# Patient Record
Sex: Female | Born: 1975 | Race: Black or African American | Hispanic: No | Marital: Single | Smoking: Current every day smoker
Health system: Southern US, Community
[De-identification: ages and names within clinical notes are randomized; demographics above are authoritative.]

## PROBLEM LIST (undated history)

## (undated) DIAGNOSIS — L309 Dermatitis, unspecified: Secondary | ICD-10-CM

## (undated) DIAGNOSIS — E039 Hypothyroidism, unspecified: Secondary | ICD-10-CM

## (undated) DIAGNOSIS — G43909 Migraine, unspecified, not intractable, without status migrainosus: Secondary | ICD-10-CM

## (undated) DIAGNOSIS — D649 Anemia, unspecified: Secondary | ICD-10-CM

## (undated) DIAGNOSIS — E11649 Type 2 diabetes mellitus with hypoglycemia without coma: Secondary | ICD-10-CM

## (undated) HISTORY — PX: TUBAL LIGATION: SHX77

---

## 2010-07-16 ENCOUNTER — Emergency Department (HOSPITAL_COMMUNITY)
Admission: EM | Admit: 2010-07-16 | Discharge: 2010-07-16 | Payer: Self-pay | Source: Home / Self Care | Admitting: Family Medicine

## 2010-07-19 LAB — POCT PREGNANCY, URINE: Preg Test, Ur: NEGATIVE

## 2010-07-19 LAB — POCT URINALYSIS DIPSTICK
Protein, ur: NEGATIVE mg/dL
Urobilinogen, UA: 0.2 mg/dL (ref 0.0–1.0)

## 2010-10-10 ENCOUNTER — Inpatient Hospital Stay (HOSPITAL_COMMUNITY)
Admission: AD | Admit: 2010-10-10 | Discharge: 2010-10-10 | Disposition: A | Discharge status: Home / Self Care | Payer: Medicaid Other | Source: Ambulatory Visit | Attending: Family Medicine | Admitting: Family Medicine

## 2010-10-10 DIAGNOSIS — N949 Unspecified condition associated with female genital organs and menstrual cycle: Secondary | ICD-10-CM | POA: Insufficient documentation

## 2010-10-10 DIAGNOSIS — N938 Other specified abnormal uterine and vaginal bleeding: Secondary | ICD-10-CM | POA: Insufficient documentation

## 2010-10-10 LAB — CBC
HCT: 35.8 % — ABNORMAL LOW (ref 36.0–46.0)
Hemoglobin: 11.9 g/dL — ABNORMAL LOW (ref 12.0–15.0)
MCV: 86.9 fL (ref 78.0–100.0)
RBC: 4.12 MIL/uL (ref 3.87–5.11)
WBC: 8.9 10*3/uL (ref 4.0–10.5)

## 2010-10-10 LAB — URINALYSIS, ROUTINE W REFLEX MICROSCOPIC
Bilirubin Urine: NEGATIVE
Ketones, ur: NEGATIVE mg/dL
Protein, ur: NEGATIVE mg/dL
Urobilinogen, UA: 1 mg/dL (ref 0.0–1.0)

## 2010-10-10 LAB — HCG, QUANTITATIVE, PREGNANCY: hCG, Beta Chain, Quant, S: <2 m[IU]/mL (ref <5)

## 2010-10-10 LAB — WET PREP, GENITAL
Clue Cells Wet Prep HPF POC: NONE SEEN
Trich, Wet Prep: NONE SEEN

## 2010-10-11 LAB — GC/CHLAMYDIA PROBE AMP, GENITAL: GC Probe Amp, Genital: NEGATIVE

## 2011-06-23 ENCOUNTER — Inpatient Hospital Stay (HOSPITAL_COMMUNITY)
Admission: AD | Admit: 2011-06-23 | Discharge: 2011-06-24 | Disposition: A | Discharge status: Home / Self Care | Payer: Medicaid Other | Source: Ambulatory Visit | Attending: Obstetrics & Gynecology | Admitting: Obstetrics & Gynecology

## 2011-06-23 ENCOUNTER — Encounter (HOSPITAL_COMMUNITY): Payer: Self-pay | Admitting: Obstetrics and Gynecology

## 2011-06-23 DIAGNOSIS — N911 Secondary amenorrhea: Secondary | ICD-10-CM

## 2011-06-23 DIAGNOSIS — R112 Nausea with vomiting, unspecified: Secondary | ICD-10-CM

## 2011-06-23 DIAGNOSIS — N912 Amenorrhea, unspecified: Secondary | ICD-10-CM

## 2011-06-23 HISTORY — DX: Dermatitis, unspecified: L30.9

## 2011-06-23 HISTORY — DX: Hypothyroidism, unspecified: E03.9

## 2011-06-23 LAB — URINALYSIS, ROUTINE W REFLEX MICROSCOPIC
Bilirubin Urine: NEGATIVE
Glucose, UA: NEGATIVE mg/dL
Hgb urine dipstick: NEGATIVE
Protein, ur: NEGATIVE mg/dL
Urobilinogen, UA: 1 mg/dL (ref 0.0–1.0)

## 2011-06-23 MED ORDER — ONDANSETRON 8 MG PO TBDP
8.0000 mg | ORAL_TABLET | Freq: Once | ORAL | Status: AC
  Administered 2011-06-23: 8 mg via ORAL
  Filled 2011-06-23: qty 1

## 2011-06-23 NOTE — ED Provider Notes (Signed)
History   Cheryl Gilbert is a 35 y.o. year old [id number] female at 5.[redacted] weeks gestation by LMP who presents to [facility] reporting N/V x ~2 weeks and experiencing a upper adb cramp after dry-heaving today that resolved spontaneously. She denies cramping or bleeding. She states she had a faintly pos UPT a few days ago, but that UPTs "do not work for her until later in the pregnancy". She wants to verify that she is pregnant.    CSN: [id number]  Arrival date & time 06/23/11  2119   None     Chief Complaint  Patient presents with  . Abdominal Pain    (Consider location/radiation/quality/duration/timing/severity/associated sxs/prior treatment) HPI  Past Medical History  Diagnosis Date  . Eczema   . Hypothyroidism     "Not on meds since 1996"  . Asthma     Past Surgical History  Procedure Date  . Tubal ligation 11/01/2000    Family History  Problem Relation Age of Onset  . Cancer Mother   . Mental illness Mother   . Multiple sclerosis Father   . Hypertension Maternal Grandmother   . Diabetes Maternal Grandfather   . Hypertension Maternal Grandfather     History  Substance Use Topics  . Smoking status: Passive Smoker -- 0.2 packs/day for 6 years    Types: Cigarettes  . Smokeless tobacco: Not on file  . Alcohol Use: No    OB History    Grav Para Term Preterm Abortions TAB SAB Ect Mult Living   7 5 3 2 2  0 2 0 0 5      Review of Systems  Constitutional: Negative for fever and chills.  Gastrointestinal: Positive for nausea and vomiting. Negative for abdominal pain, diarrhea and constipation.  Genitourinary: Negative for dysuria, hematuria, vaginal bleeding and vaginal discharge.  Neurological: Negative for weakness.    Allergies  Review of patient's allergies indicates no known allergies.  Home Medications  No current outpatient prescriptions on file.  Temp(Src) 97.6 F (36.4 C) (Oral)  Resp 20  Ht 5\' 3"  (1.6 m)  Wt 113.399 kg (250 lb)  BMI 44.29  kg/m2  LMP 05/15/2011  Physical Exam  Constitutional: She is oriented to person, place, and time. She appears well-developed and well-nourished. No distress.  Cardiovascular: Normal rate.   Pulmonary/Chest: Effort normal.  Abdominal: Soft. She exhibits distension. There is no tenderness. There is no rebound and no guarding.  Neurological: She is alert and oriented to person, place, and time.  Skin: Skin is warm and dry.  Psychiatric: She has a normal mood and affect.    ED Course  Procedures (including critical care time) Results for orders placed during the hospital encounter of 06/23/11 (from the past 48 hour(s))  URINALYSIS, ROUTINE W REFLEX MICROSCOPIC     Status: Abnormal   Collection Time   06/23/11  9:40 PM      Component Value Range Comment   Color, Urine YELLOW  YELLOW     APPearance CLEAR  CLEAR     Specific Gravity, Urine 1.025  1.005 - 1.030     pH 6.0  5.0 - 8.0     Glucose, UA NEGATIVE  NEGATIVE (mg/dL)    Hgb urine dipstick NEGATIVE  NEGATIVE     Bilirubin Urine NEGATIVE  NEGATIVE     Ketones, ur 15 (*) NEGATIVE (mg/dL)    Protein, ur NEGATIVE  NEGATIVE (mg/dL)    Urobilinogen, UA 1.0  0.0 - 1.0 (mg/dL)    Nitrite NEGATIVE  NEGATIVE     Leukocytes, UA NEGATIVE  NEGATIVE  MICROSCOPIC NOT DONE ON URINES WITH NEGATIVE PROTEIN, BLOOD, LEUKOCYTES, NITRITE, OR GLUCOSE <1000 mg/dL.  POCT PREGNANCY, URINE     Status: Normal   Collection Time   06/23/11 10:00 PM      Component Value Range Comment   Preg Test, Ur NEGATIVE     HCG, QUANTITATIVE, PREGNANCY     Status: Normal   Collection Time   06/23/11 10:05 PM      Component Value Range Comment   hCG, Beta Chain, Quant, S < 1: REPEATED TO VERIFY  <5 (mIU/mL)     Nausea resolved w/ Zofran  MDM  Assessment: 1. Secondary amenorrhea w/ unknown etiology 2. N/V of unknown etiology  Plan; 1. D/C home 2. F/U w/ Gynecologist if no menses in 1 month 3. F/U w/ PCP for N/V  Dorathy Kinsman 06/24/2011 1:46 AM

## 2011-06-23 NOTE — Progress Notes (Signed)
"  I only had coffee this morning and felt nauseated about 1000.  I vomited and had a sharp pain in my abd that felt like a Charley Horse does in your leg."

## 2011-06-24 LAB — HCG, QUANTITATIVE, PREGNANCY: hCG, Beta Chain, Quant, S: <1 m[IU]/mL (ref <5)

## 2011-07-27 ENCOUNTER — Emergency Department (HOSPITAL_COMMUNITY)
Admission: EM | Admit: 2011-07-27 | Discharge: 2011-07-28 | Disposition: A | Discharge status: Home / Self Care | Payer: Self-pay | Attending: Emergency Medicine | Admitting: Emergency Medicine

## 2011-07-27 ENCOUNTER — Encounter (HOSPITAL_COMMUNITY): Payer: Self-pay | Admitting: Emergency Medicine

## 2011-07-27 DIAGNOSIS — N83202 Unspecified ovarian cyst, left side: Secondary | ICD-10-CM

## 2011-07-27 DIAGNOSIS — E039 Hypothyroidism, unspecified: Secondary | ICD-10-CM | POA: Insufficient documentation

## 2011-07-27 DIAGNOSIS — R1032 Left lower quadrant pain: Secondary | ICD-10-CM | POA: Insufficient documentation

## 2011-07-27 DIAGNOSIS — N83209 Unspecified ovarian cyst, unspecified side: Secondary | ICD-10-CM | POA: Insufficient documentation

## 2011-07-27 DIAGNOSIS — J45909 Unspecified asthma, uncomplicated: Secondary | ICD-10-CM | POA: Insufficient documentation

## 2011-07-27 DIAGNOSIS — R109 Unspecified abdominal pain: Secondary | ICD-10-CM | POA: Insufficient documentation

## 2011-07-27 LAB — CBC
HCT: 37.1 % (ref 36.0–46.0)
MCHC: 35 g/dL (ref 30.0–36.0)
MCV: 83.9 fL (ref 78.0–100.0)
Platelets: 326 10*3/uL (ref 150–400)
RDW: 13.7 % (ref 11.5–15.5)
WBC: 9.8 10*3/uL (ref 4.0–10.5)

## 2011-07-27 LAB — DIFFERENTIAL
Basophils Absolute: 0 10*3/uL (ref 0.0–0.1)
Basophils Relative: 0 % (ref 0–1)
Eosinophils Absolute: 0.2 10*3/uL (ref 0.0–0.7)
Eosinophils Relative: 2 % (ref 0–5)
Lymphocytes Relative: 43 % (ref 12–46)
Monocytes Absolute: 0.6 10*3/uL (ref 0.1–1.0)

## 2011-07-27 LAB — BASIC METABOLIC PANEL WITH GFR
BUN: 7 mg/dL (ref 6–23)
CO2: 27 meq/L (ref 19–32)
Calcium: 9.6 mg/dL (ref 8.4–10.5)
Chloride: 101 meq/L (ref 96–112)
Creatinine, Ser: 0.84 mg/dL (ref 0.50–1.10)
GFR calc Af Amer: >90 mL/min
GFR calc non Af Amer: 90 mL/min — ABNORMAL LOW
Glucose, Bld: 94 mg/dL (ref 70–99)
Potassium: 3.8 meq/L (ref 3.5–5.1)
Sodium: 135 meq/L (ref 135–145)

## 2011-07-27 LAB — URINALYSIS, MICROSCOPIC ONLY
Bilirubin Urine: NEGATIVE
Nitrite: NEGATIVE
Specific Gravity, Urine: 1.028 (ref 1.005–1.030)
pH: 6 (ref 5.0–8.0)

## 2011-07-27 LAB — POCT PREGNANCY, URINE: Preg Test, Ur: NEGATIVE

## 2011-07-27 NOTE — ED Provider Notes (Signed)
History     CSN: [id number]  Arrival date & time 07/27/11  2208   First MD Initiated Contact with Patient 07/27/11 2352      Chief Complaint  Patient presents with  . Abdominal Pain    (Consider location/radiation/quality/duration/timing/severity/associated sxs/prior treatment) HPI This is a 36 year old black female who started having flank pain radiating to her left lower quadrant this afternoon. The pain has been waxing and waning and is not constant. It is described as a deep dull pain. It is moderate to severe. It is worse with palpation of the flank but not the abdomen. She took some ibuprofen earlier with improvement. There's been no associated nausea, vomiting, hematuria or dysuria. She's had no fevers or chills. She has had vaginal spotting for about 3 days. Her last normal menstrual period was November 19 of last year. She states she's been pregnant 5 times and on four of those occasions her pregnancy test was negative. She also states she has had bilateral tubal ligation but that they had "become untied".  Past Medical History  Diagnosis Date  . Eczema   . Hypothyroidism     "Not on meds since 1996"  . Asthma     Past Surgical History  Procedure Date  . Tubal ligation 11/01/2000    Family History  Problem Relation Age of Onset  . Cancer Mother   . Mental illness Mother   . Multiple sclerosis Father   . Hypertension Maternal Grandmother   . Diabetes Maternal Grandfather   . Hypertension Maternal Grandfather     History  Substance Use Topics  . Smoking status: Passive Smoker -- 0.2 packs/day for 6 years    Types: Cigarettes  . Smokeless tobacco: Not on file  . Alcohol Use: No    OB History    Grav Para Term Preterm Abortions TAB SAB Ect Mult Living   7 5 3 2 2  0 2 0 0 5      Review of Systems  All other systems reviewed and are negative.    Allergies  Review of patient's allergies indicates no known allergies.  Home Medications   Current  Outpatient Rx  Name Route Sig Dispense Refill  . ALBUTEROL SULFATE HFA 108 (90 BASE) MCG/ACT IN AERS Inhalation Inhale 1 puff into the lungs 2 (two) times daily as needed. For asthma. Pt states, if she has to use this more than a few times a day, she will then use a nebulizer     . IBUPROFEN 200 MG PO TABS Oral Take 400 mg by mouth daily as needed. As needed for pain.      BP 121/92  Pulse 84  Temp(Src) 97.7 F (36.5 C) (Oral)  Resp 17  SpO2 100%  LMP 05/15/2011  Physical Exam General: Well-developed, well-nourished female in no acute distress; appearance consistent with age of record HENT: normocephalic, atraumatic Eyes: pupils equal round and reactive to light; extraocular muscles intact Neck: supple Heart: regular rate and rhythm Lungs: clear to auscultation bilaterally Abdomen: soft; nondistended; nontender; no masses or hepatosplenomegaly; bowel sounds present GU: Moderate left flank tenderness Extremities: No deformity; full range of motion; pulses normal Neurologic: Awake, alert and oriented; motor function intact in all extremities and symmetric; no facial droop Skin: Warm and dry Psychiatric: Normal mood and affect    ED Course  Procedures (including critical care time)     MDM   Nursing notes and vitals signs, including pulse oximetry, reviewed.  Summary of this visit's results, reviewed by  myself:  Labs:  Results for orders placed during the hospital encounter of 07/27/11  URINALYSIS, WITH MICROSCOPIC      Component Value Range   Color, Urine YELLOW  YELLOW    APPearance CLEAR  CLEAR    Specific Gravity, Urine 1.028  1.005 - 1.030    pH 6.0  5.0 - 8.0    Glucose, UA NEGATIVE  NEGATIVE (mg/dL)   Hgb urine dipstick LARGE (*) NEGATIVE    Bilirubin Urine NEGATIVE  NEGATIVE    Ketones, ur 15 (*) NEGATIVE (mg/dL)   Protein, ur NEGATIVE  NEGATIVE (mg/dL)   Urobilinogen, UA 1.0  0.0 - 1.0 (mg/dL)   Nitrite NEGATIVE  NEGATIVE    Leukocytes, UA NEGATIVE   NEGATIVE    WBC, UA 0-2  <3 (WBC/hpf)   RBC / HPF 7-10  <3 (RBC/hpf)   Bacteria, UA FEW (*) RARE    Squamous Epithelial / LPF FEW (*) RARE    Urine-Other MUCOUS PRESENT    CBC      Component Value Range   WBC 9.8  4.0 - 10.5 (K/uL)   RBC 4.42  3.87 - 5.11 (MIL/uL)   Hemoglobin 13.0  12.0 - 15.0 (g/dL)   HCT 91.4  78.2 - 95.6 (%)   MCV 83.9  78.0 - 100.0 (fL)   MCH 29.4  26.0 - 34.0 (pg)   MCHC 35.0  30.0 - 36.0 (g/dL)   RDW 21.3  08.6 - 57.8 (%)   Platelets 326  150 - 400 (K/uL)  DIFFERENTIAL      Component Value Range   Neutrophils Relative 49  43 - 77 (%)   Neutro Abs 4.8  1.7 - 7.7 (K/uL)   Lymphocytes Relative 43  12 - 46 (%)   Lymphs Abs 4.2 (*) 0.7 - 4.0 (K/uL)   Monocytes Relative 6  3 - 12 (%)   Monocytes Absolute 0.6  0.1 - 1.0 (K/uL)   Eosinophils Relative 2  0 - 5 (%)   Eosinophils Absolute 0.2  0.0 - 0.7 (K/uL)   Basophils Relative 0  0 - 1 (%)   Basophils Absolute 0.0  0.0 - 0.1 (K/uL)  BASIC METABOLIC PANEL      Component Value Range   Sodium 135  135 - 145 (mEq/L)   Potassium 3.8  3.5 - 5.1 (mEq/L)   Chloride 101  96 - 112 (mEq/L)   CO2 27  19 - 32 (mEq/L)   Glucose, Bld 94  70 - 99 (mg/dL)   BUN 7  6 - 23 (mg/dL)   Creatinine, Ser 4.69  0.50 - 1.10 (mg/dL)   Calcium 9.6  8.4 - 62.9 (mg/dL)   GFR calc non Af Amer 90 (*) >90 (mL/min)   GFR calc Af Amer >90  >90 (mL/min)  POCT PREGNANCY, URINE      Component Value Range   Preg Test, Ur NEGATIVE  NEGATIVE     Imaging Studies: US Transvaginal Non-ob  08-26-2011  *RADIOLOGY REPORT*  Clinical Data: Pain.  TRANSABDOMINAL AND TRANSVAGINAL ULTRASOUND OF PELVIS Technique:  Both transabdominal and transvaginal ultrasound examinations of the pelvis were performed. Transabdominal technique was performed for global imaging of the pelvis including uterus, ovaries, adnexal regions, and pelvic cul-de-sac.  Comparison: None.   It was necessary to proceed with endovaginal exam following the transabdominal exam to visualize  the uterus and adnexal structures.  Findings:  Uterus: The uterus measures 9.9 x 5.3 x 6 cm.  No myometrial mass lesions. Small cysts in  the cervix consistent with Nabothian cysts.  Endometrium: Endometrial stripe thickness measures 1.4 cm which is mildly increased but may be due to secretory phase.  No abnormal endometrial fluid collections.  Endometrial echotexture is homogeneous.  Right ovary:  The right ovary measures 3 x 1.2 x 3 cm.  No abnormal adnexal masses.  Left ovary: The left ovary measures 5 x 3.4 x 3.1 cm.  In the left ovary, there is a complex septated cyst measuring 4.8 x 2.7 x 2.4 cm.  Flow is demonstrated within the septum on color flow Doppler imaging.  This is likely to represent a complex functional cyst. Short-interval follow up ultrasound in 6-12 weeks is recommended, preferably during the week following the patient's normal menses.  Other findings: No free fluid  IMPRESSION: Mild prominence of endometrial stripe with homogeneous appearance, likely due to secretory phase.  Uterus and right ovary are unremarkable.  Complex septated cyst in the left ovary.  Followup in 6-12 weeks is recommended.  Original Report Authenticated By: Marlon Pel, M.D.   US Pelvis Complete  07/28/2011  *RADIOLOGY REPORT*  Clinical Data: Pain.  TRANSABDOMINAL AND TRANSVAGINAL ULTRASOUND OF PELVIS Technique:  Both transabdominal and transvaginal ultrasound examinations of the pelvis were performed. Transabdominal technique was performed for global imaging of the pelvis including uterus, ovaries, adnexal regions, and pelvic cul-de-sac.  Comparison: None.   It was necessary to proceed with endovaginal exam following the transabdominal exam to visualize the uterus and adnexal structures.  Findings:  Uterus: The uterus measures 9.9 x 5.3 x 6 cm.  No myometrial mass lesions. Small cysts in the cervix consistent with Nabothian cysts.  Endometrium: Endometrial stripe thickness measures 1.4 cm which is mildly  increased but may be due to secretory phase.  No abnormal endometrial fluid collections.  Endometrial echotexture is homogeneous.  Right ovary:  The right ovary measures 3 x 1.2 x 3 cm.  No abnormal adnexal masses.  Left ovary: The left ovary measures 5 x 3.4 x 3.1 cm.  In the left ovary, there is a complex septated cyst measuring 4.8 x 2.7 x 2.4 cm.  Flow is demonstrated within the septum on color flow Doppler imaging.  This is likely to represent a complex functional cyst. Short-interval follow up ultrasound in 6-12 weeks is recommended, preferably during the week following the patient's normal menses.  Other findings: No free fluid  IMPRESSION: Mild prominence of endometrial stripe with homogeneous appearance, likely due to secretory phase.  Uterus and right ovary are unremarkable.  Complex septated cyst in the left ovary.  Followup in 6-12 weeks is recommended.  Original Report Authenticated By: Marlon Pel, M.D.   3:52 AM Patient now has left lower quadrant tenderness which began following the ultrasound. She was advised of the ultrasound findings displaying a left ovarian cyst. She has a gynecologist at [facility]; she was advised to call his office later this morning to arrange followup. She was also advised that should the pain worsen or change she should return for further evaluation.         Hanley Seamen, MD 07/28/11 [phone]

## 2011-07-27 NOTE — ED Notes (Signed)
PT. REPORTS LEFT ABDOMINAL PAIN /LEFT BACK PAIN WITH SPOTTING ONSET THIS AFTERNOON , DENIES NAUSEA OR VOMITTING , NO FEVER OR CHILLS,  STATES LMP LAST NOV. 19, 2012. UNSURE IF SHE IS PREGNANT.

## 2011-07-27 NOTE — ED Notes (Signed)
The pt has had lt flank  And lt abd pain all day.  Her lmp was nov 19 but she has had a neg preg test since then.  She has some vaginal spotting now..  Her periods have been irregular in the past but she has never had this length of time

## 2011-07-28 ENCOUNTER — Emergency Department (HOSPITAL_COMMUNITY): Payer: Self-pay

## 2011-07-28 MED ORDER — HYDROCODONE-ACETAMINOPHEN 5-325 MG PO TABS: 1.0000 | ORAL_TABLET | Freq: Four times a day (QID) | ORAL | Status: AC | PRN

## 2011-07-28 NOTE — ED Notes (Signed)
The pt went to ultrasound.

## 2011-07-28 NOTE — ED Notes (Signed)
Blanket given still waiting for the [place] tech to arrive.  Pt informed

## 2011-07-28 NOTE — ED Notes (Signed)
The pt is in no distress.  Up to the br.  No assistance needed

## 2011-07-28 NOTE — ED Notes (Signed)
rx x 1, pt voiced understanding to f/u with gynecologist today

## 2012-12-06 ENCOUNTER — Encounter (HOSPITAL_COMMUNITY): Payer: Self-pay | Admitting: Emergency Medicine

## 2012-12-06 DIAGNOSIS — G43909 Migraine, unspecified, not intractable, without status migrainosus: Secondary | ICD-10-CM | POA: Insufficient documentation

## 2012-12-06 DIAGNOSIS — Z8639 Personal history of other endocrine, nutritional and metabolic disease: Secondary | ICD-10-CM | POA: Insufficient documentation

## 2012-12-06 DIAGNOSIS — J45909 Unspecified asthma, uncomplicated: Secondary | ICD-10-CM | POA: Insufficient documentation

## 2012-12-06 DIAGNOSIS — Z872 Personal history of diseases of the skin and subcutaneous tissue: Secondary | ICD-10-CM | POA: Insufficient documentation

## 2012-12-06 DIAGNOSIS — Z862 Personal history of diseases of the blood and blood-forming organs and certain disorders involving the immune mechanism: Secondary | ICD-10-CM | POA: Insufficient documentation

## 2012-12-06 DIAGNOSIS — R110 Nausea: Secondary | ICD-10-CM | POA: Insufficient documentation

## 2012-12-06 NOTE — ED Notes (Signed)
PT. REPORTS MIGRAINE HEADACHE FOR 2 DAYS WITH NAUSEA.

## 2012-12-07 ENCOUNTER — Emergency Department (HOSPITAL_COMMUNITY)
Admission: EM | Admit: 2012-12-07 | Discharge: 2012-12-07 | Disposition: A | Discharge status: Home / Self Care | Payer: Self-pay | Attending: Emergency Medicine | Admitting: Emergency Medicine

## 2012-12-07 HISTORY — DX: Migraine, unspecified, not intractable, without status migrainosus: G43.909

## 2012-12-07 MED ORDER — SODIUM CHLORIDE 0.9 % IV BOLUS (SEPSIS)
1000.0000 mL | Freq: Once | INTRAVENOUS | Status: AC
  Administered 2012-12-07: 1000 mL via INTRAVENOUS

## 2012-12-07 MED ORDER — DIPHENHYDRAMINE HCL 50 MG/ML IJ SOLN
25.0000 mg | Freq: Once | INTRAMUSCULAR | Status: AC
  Administered 2012-12-07: 25 mg via INTRAVENOUS
  Filled 2012-12-07: qty 1

## 2012-12-07 MED ORDER — IBUPROFEN 800 MG PO TABS: 800.0000 mg | ORAL_TABLET | Freq: Three times a day (TID) | ORAL | Status: DC

## 2012-12-07 MED ORDER — METOCLOPRAMIDE HCL 5 MG/ML IJ SOLN
10.0000 mg | Freq: Once | INTRAMUSCULAR | Status: AC
  Administered 2012-12-07: 10 mg via INTRAVENOUS
  Filled 2012-12-07: qty 2

## 2012-12-07 MED ORDER — DEXAMETHASONE SODIUM PHOSPHATE 4 MG/ML IJ SOLN
10.0000 mg | Freq: Once | INTRAMUSCULAR | Status: AC
  Administered 2012-12-07: 10 mg via INTRAVENOUS
  Filled 2012-12-07: qty 3
  Filled 2012-12-07: qty 1

## 2012-12-07 NOTE — ED Provider Notes (Signed)
History     CSN: [id number]  Arrival date & time 12/06/12  2327   First MD Initiated Contact with Patient 12/07/12 0026      Chief Complaint  Patient presents with  . Migraine    (Consider location/radiation/quality/duration/timing/severity/associated sxs/prior treatment) HPI HX per PT  - frontal HA onset today, gradual, took execdrin migraine with minimal relief, getting worse tonight, feels like her typical migraines, no neck pain, fever, rash or recent travel.  Pain mod to severe, light bothers her eyes. No weakness or numbness, no trouble with gait or speech  Past Medical History  Diagnosis Date  . Eczema   . Hypothyroidism     "Not on meds since 1996"  . Asthma   . Migraine headache     Past Surgical History  Procedure Laterality Date  . Tubal ligation  11/01/2000    Family History  Problem Relation Age of Onset  . Cancer Mother   . Mental illness Mother   . Multiple sclerosis Father   . Hypertension Maternal Grandmother   . Diabetes Maternal Grandfather   . Hypertension Maternal Grandfather     History  Substance Use Topics  . Smoking status: Passive Smoke Exposure - Never Smoker -- 0.25 packs/day for 6 years    Types: Cigarettes  . Smokeless tobacco: Not on file  . Alcohol Use: No    OB History   Grav Para Term Preterm Abortions TAB SAB Ect Mult Living   7 5 3 2 2  0 2 0 0 5      Review of Systems  Constitutional: Negative for fever and chills.  HENT: Negative for neck pain and neck stiffness.   Eyes: Negative for pain.  Respiratory: Negative for shortness of breath.   Cardiovascular: Negative for chest pain.  Gastrointestinal: Positive for nausea. Negative for vomiting and abdominal pain.  Genitourinary: Negative for dysuria.  Musculoskeletal: Negative for back pain.  Skin: Negative for rash.  Neurological: Negative for headaches.  All other systems reviewed and are negative.    Allergies  Review of patient's allergies indicates no known  allergies.  Home Medications   Current Outpatient Rx  Name  Route  Sig  Dispense  Refill  . ibuprofen (ADVIL,MOTRIN) 800 MG tablet   Oral   Take 1 tablet (800 mg total) by mouth 3 (three) times daily.   21 tablet   0     BP 139/81  Pulse 76  Temp(Src) 97.6 F (36.4 C)  Resp 16  SpO2 98%  LMP 10/06/2012  Physical Exam  Constitutional: She is oriented to person, place, and time. She appears well-developed and well-nourished.  HENT:  Head: Normocephalic and atraumatic.  Eyes: Conjunctivae and EOM are normal. Pupils are equal, round, and reactive to light.  Neck: Full passive range of motion without pain. Neck supple. No thyromegaly present.  No meningismus  Cardiovascular: Normal rate, regular rhythm, S1 normal, S2 normal and intact distal pulses.   Pulmonary/Chest: Effort normal and breath sounds normal.  Abdominal: Soft. Bowel sounds are normal. There is no tenderness. There is no CVA tenderness.  Musculoskeletal: Normal range of motion.  Neurological: She is alert and oriented to person, place, and time. She has normal strength and normal reflexes. No cranial nerve deficit or sensory deficit. She displays a negative Romberg sign. GCS eye subscore is 4. GCS verbal subscore is 5. GCS motor subscore is 6.  Normal Gait  Skin: Skin is warm and dry. No rash noted. No cyanosis. Nails show no  clubbing.  Psychiatric: She has a normal mood and affect. Her speech is normal and behavior is normal.    ED Course  Procedures (including critical care time)    1. Headache    IVFs, HA cocktail - benadryl, decadron, reglan  1:39 AM HA improving, PT requesting to be discharged  - no change normal neuro exam  Plan d/c home, Rx motrin provided, follow up outpatient, HA precautions provided MDM  HA with h/o migraines and presentation suggesting the same  Improved with IVFs and medications  VS and nursing notes reviewed        Sunnie Nielsen, MD 12/07/12 0140

## 2013-07-10 ENCOUNTER — Encounter (HOSPITAL_COMMUNITY): Payer: Self-pay | Admitting: Emergency Medicine

## 2013-07-10 ENCOUNTER — Encounter (HOSPITAL_COMMUNITY): Payer: Self-pay | Admitting: *Deleted

## 2013-07-10 ENCOUNTER — Emergency Department (INDEPENDENT_AMBULATORY_CARE_PROVIDER_SITE_OTHER)
Admission: EM | Admit: 2013-07-10 | Discharge: 2013-07-10 | Disposition: A | Discharge status: Home / Self Care | Payer: Self-pay | Source: Home / Self Care | Attending: Family Medicine | Admitting: Family Medicine

## 2013-07-10 ENCOUNTER — Inpatient Hospital Stay (HOSPITAL_COMMUNITY)
Admission: AD | Admit: 2013-07-10 | Discharge: 2013-07-10 | Disposition: A | Discharge status: Home / Self Care | Payer: Self-pay | Source: Ambulatory Visit | Attending: Obstetrics & Gynecology | Admitting: Obstetrics & Gynecology

## 2013-07-10 DIAGNOSIS — N949 Unspecified condition associated with female genital organs and menstrual cycle: Secondary | ICD-10-CM | POA: Insufficient documentation

## 2013-07-10 DIAGNOSIS — N750 Cyst of Bartholin's gland: Secondary | ICD-10-CM | POA: Insufficient documentation

## 2013-07-10 DIAGNOSIS — N760 Acute vaginitis: Secondary | ICD-10-CM

## 2013-07-10 HISTORY — DX: Anemia, unspecified: D64.9

## 2013-07-10 MED ORDER — SULFAMETHOXAZOLE-TRIMETHOPRIM 800-160 MG PO TABS
1.0000 | ORAL_TABLET | Freq: Two times a day (BID) | ORAL | Status: AC
Start: 2013-07-10 — End: 2013-07-17

## 2013-07-10 NOTE — ED Provider Notes (Signed)
CSN: [id number]     Arrival date & time 07/10/13  1014 History   First MD Initiated Contact with Patient 07/10/13 1137     Chief Complaint  Patient presents with  . Pelvic Pain   (Consider location/radiation/quality/duration/timing/severity/associated sxs/prior Treatment) HPI Comments: Patient presents with a 10-14 day history of progressive vaginal pain and swelling without associated vaginal bleeding or discharge. States discomfort is now preventing her from sitting comfortably and pain radiates to lower abdomen/pelvis. Denies fever or previous episodes. Denies urinary symptoms.   Patient is a 38 y.o. female presenting with pelvic pain. The history is provided by the patient.  Pelvic Pain Pertinent negatives include no abdominal pain.    Past Medical History  Diagnosis Date  . Eczema   . Hypothyroidism     "Not on meds since 1996"  . Asthma   . Migraine headache    Past Surgical History  Procedure Laterality Date  . Tubal ligation  11/01/2000   Family History  Problem Relation Age of Onset  . Cancer Mother   . Mental illness Mother   . Multiple sclerosis Father   . Hypertension Maternal Grandmother   . Diabetes Maternal Grandfather   . Hypertension Maternal Grandfather    History  Substance Use Topics  . Smoking status: Passive Smoke Exposure - Never Smoker -- 0.25 packs/day for 6 years    Types: Cigarettes  . Smokeless tobacco: Not on file  . Alcohol Use: No   OB History   Grav Para Term Preterm Abortions TAB SAB Ect Mult Living   7 5 3 2 2  0 2 0 0 5     Review of Systems  Constitutional: Negative.   Respiratory: Negative.   Cardiovascular: Negative.   Gastrointestinal: Negative for nausea, vomiting, abdominal pain, diarrhea, constipation and rectal pain.  Genitourinary: Positive for genital sores, vaginal pain and pelvic pain. Negative for dysuria, frequency, flank pain, vaginal bleeding and vaginal discharge.  Musculoskeletal: Negative.     Allergies    Review of patient's allergies indicates no known allergies.  Home Medications   Current Outpatient Rx  Name  Route  Sig  Dispense  Refill  . ibuprofen (ADVIL,MOTRIN) 800 MG tablet   Oral   Take 1 tablet (800 mg total) by mouth 3 (three) times daily.   21 tablet   0    BP 93/60  Pulse 78  Temp(Src) 98.7 F (37.1 C) (Oral)  Resp 18  SpO2 99%  LMP 06/29/2013 Physical Exam  Nursing note and vitals reviewed. Constitutional: She is oriented to person, place, and time. She appears well-developed and well-nourished.  HENT:  Head: Normocephalic and atraumatic.  Cardiovascular: Normal rate, regular rhythm and normal heart sounds.   Pulmonary/Chest: Effort normal and breath sounds normal.  Abdominal: Soft. Bowel sounds are normal. She exhibits no distension. There is no tenderness.  Genitourinary:    Pelvic exam was performed with patient supine. There is erythema and tenderness around the vagina. No bleeding around the vagina. No foreign body around the vagina. No signs of injury around the vagina. No vaginal discharge found.  Musculoskeletal: Normal range of motion.  Neurological: She is alert and oriented to person, place, and time.  Skin: Skin is warm and dry.  Psychiatric: She has a normal mood and affect. Her behavior is normal.    ED Course  Procedures (including critical care time) Labs Review Labs Reviewed - No data to display Imaging Review No results found.  EKG Interpretation    Date/Time:  Ventricular Rate:    PR Interval:    QRS Duration:   QT Interval:    QTC Calculation:   R Axis:     Text Interpretation:              MDM  Patient examined with Dr. Denyse Amass. Size and location of vaginal abscess prevent safe incision and drainage here at Urgent Care facility. Case discussed with Dr. Debroah Loop (OB/GYN physician on call at [facility]). Advised to have patient report to [facility] for evaluation. Patient hemodynamically stable and will  provide her on transportation to [facility] and is familiar with location of facility.    Jess Barters [place], [place] 07/10/13 [phone]

## 2013-07-10 NOTE — ED Notes (Signed)
C/o shooting pain in pelvic area due to an abscess that she noticed about 1 week ago. Pain is 6/10, radiates downward. States she has tried heating pads and hot showers with no relief. Denies any injury or any other symptoms. Written by: Marga MelnickQuaNeisha Jones

## 2013-07-10 NOTE — MAU Note (Signed)
Sent from Urgent care, bartholin's cyst on Rt side.  First noted about a month ago.  Got bad about 4 days ago, can hardly sit. Slight fever yesterday  (99), Didn't feel well.

## 2013-07-10 NOTE — MAU Provider Note (Signed)
History     CSN: [id number]  Arrival date and time: 07/10/13 1233   First Provider Initiated Contact with Patient 07/10/13 1314      Chief Complaint  Patient presents with  . Bartholin's Cyst   HPI Ms. Cheryl Gilbert is a 38 y.o. [id number] who presents to MAU today with complaint of vaginal swelling and pain. The patient was seen at urgent care earlier today and told she may have a bartholin's gland cyst. They were unable to perform I&D at that location and sent patient here for further evaluation. Patient first noted swelling earlier this week. She state that it has become worse and more painful x 2-3 days. She denies drainage, but has noted a small amount of thin, vaginal discharge. She has tried baths, showers and warm compresses. She denies fever or N/V. Temperature yesterday was 33F.   OB History   Grav Para Term Preterm Abortions TAB SAB Ect Mult Living   7 5 3 2 2  0 2 0 0 5      Past Medical History  Diagnosis Date  . Eczema   . Hypothyroidism     "Not on meds since 1996"  . Asthma   . Migraine headache   . Anemia     History reviewed. No pertinent past surgical history.  Family History  Problem Relation Age of Onset  . Cancer Mother   . Mental illness Mother   . Multiple sclerosis Father   . Hypertension Maternal Grandmother   . Diabetes Maternal Grandfather   . Hypertension Maternal Grandfather     History  Substance Use Topics  . Smoking status: Passive Smoke Exposure - Never Smoker -- 0.25 packs/day for 6 years    Types: Cigarettes  . Smokeless tobacco: Not on file  . Alcohol Use: No    Allergies: No Known Allergies  No prescriptions prior to admission    Review of Systems  Constitutional: Negative for fever.  Gastrointestinal: Positive for abdominal pain. Negative for nausea and vomiting.  Genitourinary: Negative for dysuria, urgency and frequency.       Neg - vaginal bleeding + vaginal discharge   Physical Exam   Temperature  97.8 F (36.6 C), temperature source Oral, resp. rate 20, last menstrual period 06/29/2013.  Physical Exam  Constitutional: She is oriented to person, place, and time. She appears well-developed and well-nourished. No distress.  HENT:  Head: Normocephalic and atraumatic.  Cardiovascular: Normal rate.   Respiratory: Effort normal.  GI: Soft. She exhibits no distension and no mass. There is no tenderness. There is no rebound and no guarding.  Genitourinary:    There is tenderness on the right labia. No bleeding around the vagina. Vaginal discharge (scant thin, white discharge noted) found.  Neurological: She is alert and oriented to person, place, and time.  Skin: Skin is warm and dry. No erythema.  Psychiatric: She has a normal mood and affect.    MAU Course  Procedures None  MDM Discussed options of I&D vs continued sitz baths, warm compresses and antibiotic therapy.  Discussed risk factors for both including bleeding, infection, scarring and dyspareunia Patient would prefer to continue sitz baths and warm compresses at this time  Assessment and Plan  A: Bartholin's gland cyst  P: Discharge home Rx for Bactrim sent to patient's pharmacy Patient advised to take Ibuprofen PRN for pain Discussed warning signs for infection and reasons to return to MAU Patient may return to MAU as needed or if her condition were to change  or worsen  Freddi Starr, PA-C  07/10/2013, 1:45 PM

## 2013-07-10 NOTE — Discharge Instructions (Signed)
Bartholin's Cyst or Abscess Bartholin's glands are small glands located within the folds of skin (labia) along the sides of the lower opening of the vagina (birth canal). A cyst may develop when the duct of the gland becomes blocked. When this happens, fluid that accumulates within the cyst can become infected. This is known as an abscess. The Bartholin gland produces a mucous fluid to lubricate the outside of the vagina during sexual intercourse. SYMPTOMS   Patients with a small cyst may not have any symptoms.  Mild discomfort to severe pain depending on the size of the cyst and if it is infected (abscess).  Pain, redness, and swelling around the lower opening of the vagina.  Painful intercourse.  Pressure in the perineal area.  Swelling of the lips of the vagina (labia).  The cyst or abscess can be on one side or both sides of the vagina. DIAGNOSIS   A large swelling is seen in the lower vagina area by your caregiver.  Painful to touch.  Redness and pain, if it is an abscess. TREATMENT   Sometimes the cyst will go away on its own.  Apply warm wet compresses to the area or take hot sitz baths several times a day.  An incision to drain the cyst or abscess with local anesthesia.  Culture the pus, if it is an abscess.  Antibiotic treatment, if it is an abscess.  Cut open the gland and suture the edges to make the opening of the gland bigger (marsupialization).  Remove the whole gland if the cyst or abscess returns. PREVENTION   Practice good hygiene.  Clean the vaginal area with a mild soap and soft cloth when bathing.  Do not rub hard in the vaginal area when bathing.  Protect the crotch area with a padded cushion if you take long bike rides or ride horses.  Be sure you are well lubricated when you have sexual intercourse. HOME CARE INSTRUCTIONS   If your cyst or abscess was opened, a small piece of gauze, or a drain, may have been placed in the wound to allow  drainage. Do not remove this gauze or drain unless directed by your caregiver.  Wear feminine pads, not tampons, as needed for any drainage or bleeding.  If antibiotics were prescribed, take them exactly as directed. Finish the entire course.  Only take over-the-counter or prescription medicines for pain, discomfort, or fever as directed by your caregiver. SEEK IMMEDIATE MEDICAL CARE IF:   You have an increase in pain, redness, swelling, or drainage.  You have bleeding from the wound which results in the use of more than the number of pads suggested by your caregiver in 24 hours.  You have chills.  You have a fever.  You develop any new problems (symptoms) or aggravation of your existing condition. MAKE SURE YOU:   Understand these instructions.  Will watch your condition.  Will get help right away if you are not doing well or get worse. Document Released: 06/12/2005 Document Revised: 09/04/2011 Document Reviewed: 01/29/2008 ExitCare Patient Information 2014 [employer].  

## 2013-07-10 NOTE — Discharge Instructions (Signed)
I have discussed your case with the OB[facility]GYN physician [facility] (Dr. Debroah LoopArnold) and he is expecting your arrival. Please report directly to the maternity admissions area at [facility] for evaluation.

## 2013-07-11 NOTE — ED Provider Notes (Signed)
Medical screening examination/treatment/procedure(s) were performed by a resident physician or non-physician practitioner and as the supervising physician I was immediately available for consultation/collaboration.  Duston Smolenski, MD    Mertice Uffelman S Adhvik Canady, MD 07/11/13 0752 

## 2013-10-13 ENCOUNTER — Emergency Department (HOSPITAL_COMMUNITY): Payer: Self-pay

## 2013-10-13 ENCOUNTER — Emergency Department (HOSPITAL_COMMUNITY)
Admission: EM | Admit: 2013-10-13 | Discharge: 2013-10-13 | Disposition: A | Discharge status: Home / Self Care | Payer: Self-pay | Attending: Emergency Medicine | Admitting: Emergency Medicine

## 2013-10-13 ENCOUNTER — Encounter (HOSPITAL_COMMUNITY): Payer: Self-pay | Admitting: Emergency Medicine

## 2013-10-13 DIAGNOSIS — Z3202 Encounter for pregnancy test, result negative: Secondary | ICD-10-CM | POA: Insufficient documentation

## 2013-10-13 DIAGNOSIS — R5381 Other malaise: Secondary | ICD-10-CM | POA: Insufficient documentation

## 2013-10-13 DIAGNOSIS — R0789 Other chest pain: Secondary | ICD-10-CM

## 2013-10-13 DIAGNOSIS — J45909 Unspecified asthma, uncomplicated: Secondary | ICD-10-CM | POA: Insufficient documentation

## 2013-10-13 DIAGNOSIS — M25551 Pain in right hip: Secondary | ICD-10-CM

## 2013-10-13 DIAGNOSIS — R5383 Other fatigue: Secondary | ICD-10-CM

## 2013-10-13 DIAGNOSIS — Z872 Personal history of diseases of the skin and subcutaneous tissue: Secondary | ICD-10-CM | POA: Insufficient documentation

## 2013-10-13 DIAGNOSIS — G43909 Migraine, unspecified, not intractable, without status migrainosus: Secondary | ICD-10-CM | POA: Insufficient documentation

## 2013-10-13 DIAGNOSIS — R071 Chest pain on breathing: Secondary | ICD-10-CM | POA: Insufficient documentation

## 2013-10-13 DIAGNOSIS — M25559 Pain in unspecified hip: Secondary | ICD-10-CM | POA: Insufficient documentation

## 2013-10-13 DIAGNOSIS — F17200 Nicotine dependence, unspecified, uncomplicated: Secondary | ICD-10-CM | POA: Insufficient documentation

## 2013-10-13 DIAGNOSIS — Z8639 Personal history of other endocrine, nutritional and metabolic disease: Secondary | ICD-10-CM | POA: Insufficient documentation

## 2013-10-13 DIAGNOSIS — Z862 Personal history of diseases of the blood and blood-forming organs and certain disorders involving the immune mechanism: Secondary | ICD-10-CM | POA: Insufficient documentation

## 2013-10-13 LAB — CBC
HCT: 34.7 % — ABNORMAL LOW (ref 36.0–46.0)
Hemoglobin: 11.5 g/dL — ABNORMAL LOW (ref 12.0–15.0)
MCH: 27.4 pg (ref 26.0–34.0)
MCHC: 33.1 g/dL (ref 30.0–36.0)
MCV: 82.6 fL (ref 78.0–100.0)
PLATELETS: 325 10*3/uL (ref 150–400)
RBC: 4.2 MIL/uL (ref 3.87–5.11)
RDW: 15.1 % (ref 11.5–15.5)
WBC: 7.2 10*3/uL (ref 4.0–10.5)

## 2013-10-13 LAB — URINALYSIS, ROUTINE W REFLEX MICROSCOPIC
Glucose, UA: NEGATIVE mg/dL
Ketones, ur: 15 mg/dL — AB
Nitrite: NEGATIVE
PROTEIN: 30 mg/dL — AB
Specific Gravity, Urine: 1.026 (ref 1.005–1.030)
UROBILINOGEN UA: 1 mg/dL (ref 0.0–1.0)
pH: 6 (ref 5.0–8.0)

## 2013-10-13 LAB — BASIC METABOLIC PANEL
BUN: 10 mg/dL (ref 6–23)
CALCIUM: 8.9 mg/dL (ref 8.4–10.5)
CO2: 22 meq/L (ref 19–32)
Chloride: 104 mEq/L (ref 96–112)
Creatinine, Ser: 0.79 mg/dL (ref 0.50–1.10)
GFR calc Af Amer: >90 mL/min (ref >90)
GFR calc non Af Amer: >90 mL/min (ref >90)
Glucose, Bld: 77 mg/dL (ref 70–99)
POTASSIUM: 4.4 meq/L (ref 3.7–5.3)
SODIUM: 138 meq/L (ref 137–147)

## 2013-10-13 LAB — I-STAT TROPONIN, ED: TROPONIN I, POC: 0 ng/mL (ref 0.00–0.08)

## 2013-10-13 LAB — URINE MICROSCOPIC-ADD ON

## 2013-10-13 LAB — POC URINE PREG, ED: PREG TEST UR: NEGATIVE

## 2013-10-13 MED ORDER — IBUPROFEN 400 MG PO TABS
400.0000 mg | ORAL_TABLET | Freq: Once | ORAL | Status: AC
  Administered 2013-10-13: 400 mg via ORAL
  Filled 2013-10-13: qty 1

## 2013-10-13 MED ORDER — NAPROXEN 250 MG PO TABS: 250.0000 mg | ORAL_TABLET | Freq: Two times a day (BID) | ORAL | Status: DC

## 2013-10-13 MED ORDER — METHOCARBAMOL 500 MG PO TABS: 1000.0000 mg | ORAL_TABLET | Freq: Four times a day (QID) | ORAL | Status: DC | PRN

## 2013-10-13 MED ORDER — ACETAMINOPHEN 325 MG PO TABS
650.0000 mg | ORAL_TABLET | Freq: Once | ORAL | Status: AC
  Administered 2013-10-13: 650 mg via ORAL
  Filled 2013-10-13: qty 2

## 2013-10-13 MED ORDER — HYDROCODONE-ACETAMINOPHEN 5-325 MG PO TABS: ORAL_TABLET | ORAL | Status: DC

## 2013-10-13 NOTE — Discharge Instructions (Signed)
°Emergency Department Resource Guide °1) Find a Doctor and Pay Out of Pocket °Although you won't have to find out who is covered by your insurance plan, it is a good idea to ask around and get recommendations. You will then need to call the office and see if the doctor you have chosen will accept you as a new patient and what types of options they offer for patients who are self-pay. Some doctors offer discounts or will set up payment plans for their patients who do not have insurance, but you will need to ask so you aren't surprised when you get to your appointment. ° °2) Contact Your Local Health Department °Not all health departments have doctors that can see patients for sick visits, but many do, so it is worth a call to see if yours does. If you don't know where your local health department is, you can check in your phone book. The CDC also has a tool to help you locate your state's health department, and many state websites also have listings of all of their local health departments. ° °3) Find a Walk-in Clinic °If your illness is not likely to be very severe or complicated, you may want to try a walk in clinic. These are popping up all over the country in pharmacies, drugstores, and shopping centers. They're usually staffed by nurse practitioners or physician assistants that have been trained to treat common illnesses and complaints. They're usually fairly quick and inexpensive. However, if you have serious medical issues or chronic medical problems, these are probably not your best option. ° °No Primary Care Doctor: °- Call [facility] at  832-8000 - they can help you locate a primary care doctor that  accepts your insurance, provides certain services, etc. °- Physician Referral Service- 1-800-533-3463 ° °Chronic Pain Problems: °Organization         Address  Phone   Notes  °[facility]  (336) 297-2271 Patients need to be referred by their primary care doctor.  ° °Medication  Assistance: °Organization         Address  Phone   Notes  °[place] [facility] [address] °[place], [place] [address] (336) 641-8030 --Must be a resident of [place] °-- Must have NO insurance coverage whatsoever (no Medicaid/ Medicare, etc.) °-- The pt. MUST have a primary care doctor that directs their care regularly and follows them in the community °  °[facility]  (866) 331-1348   °[employer]  (888) 892-1162   ° °Agencies that provide inexpensive medical care: °Organization         Address  Phone   Notes  °[facility]  (336) 832-8035   °[facility]    (336) 832-7272   °[facility] [address] °[place], [place] [address] (336) 832-4777   °[facility] [address], °[place] (336) 271-4999   °[facility]    (336) 373-0678   °[facility]    (336) 272-1050   °[facility] ° [address], [place] Phone:  (336) 832-4444, Fax:  (336) 832-4440 Hours of Operation:  9 am - 6 pm, M-F.  Also accepts Medicaid/Medicare and self-pay.  °[facility] ° [address], [place] Phone: (336) 832-3150, Fax: (336) 832-3151. Hours of Operation:  8:30 am - 5:30 pm, M-F.  Also accepts Medicaid and self-pay.  °[facility] [place] [address], [place] Phone: (336) 878-6027   °[facility] [address], [place], [place] (336)723-1848, Ext. 123 Mondays & Thursdays: 7-9 AM.  First 15 patients are seen on a first come, first serve basis. °  ° °Medicaid-accepting [place] Providers: ° °Organization         Address  Phone   Notes  °[facility] [address], [place] (336) 641-2100 Also accepts self-pay patients.  °[facility] [address], [place] ° (336) 856-9996   °[facility] [address], [place]  (336) 288-8857   °[facility] [address] [place] [address], [place] (336) 299-7000   °Veita Bland [address], [place]  ° (336) 373-1557 Only accepts [place] Access Medicaid patients after they have their name applied to their card.  ° °Self-Pay (no insurance) in [place]: ° °Organization         Address  Phone   Notes  °Sickle Cell Patients, [facility] [address], [place] (336) 832-1970   °[facility] [address], [place] (336) 832-4400   °[facility] ° [address], [place] (336) 992-4800   °[facility]/Dr. Osei-Bonsu ° [address] [place] [address], [place] or [address], [place] (336) 841-8500 Phone number for both [place] and [place] locations is the same.  °[facility] [address], [place] (336) 299-0000   °[facility] [address] [place] [address], [place] or [address] (336) 852-7530 °(336) 878-2260   °[facility] [address], [place] (336) 350-1642, phone; (336) 294-5005, fax Sees patients 1st and 3rd Saturday of every month.  Must not qualify for public or private insurance (i.e. Medicaid, Medicare, [place] Health Choice, Veterans' Benefits) • Household income should be no more than 200% of the poverty level •The clinic cannot treat you if you are pregnant or think you are pregnant • Sexually transmitted diseases are not treated at the clinic.  ° ° °Dental Care: °Organization         Address  Phone  Notes  °[place] [facility] [address], [place] (336) 641-6152 Accepts children up to age 21 who are enrolled in Medicaid or [place] Health Choice; pregnant women with a Medicaid card; and children who have applied for Medicaid or [place] Health Choice, but were declined, whose parents can pay a reduced fee at time of service.  °[place]  [facility] [place]  [address], [place] (336) 641-7733 Accepts children up to age 21 who are enrolled in Medicaid or [place] Health Choice; pregnant women with a Medicaid card; and children who have applied for Medicaid or  Health Choice, but were declined, whose parents can pay a reduced fee at time of service.  °[facility] ° [address], [place] (336) 641-4533 Patients are seen by appointment only. Walk-ins are not accepted. [facility] will see patients 18 years of age and older. °Monday - Tuesday (8am-5pm) °Most Wednesdays (8:30-5pm) °$30 per visit, cash only  °[facility] ° [address], [place] (336) 641-4533 Patients are seen by appointment only. Walk-ins are not accepted. [facility] will see patients 18 years of age and older. °One   Wednesday Evening (Monthly: Volunteer Based).  $30 per visit, cash only  °[facility]  (919) 537-3737 for adults; Children under age 4, call Graduate Pediatric Dentistry at (919) [phone]. Children aged 4-14, please call (919) [phone] to request a pediatric application. ° Dental services are provided in all areas of dental care including fillings, crowns and bridges, complete and partial dentures, implants, gum treatment, root canals, and extractions. Preventive care is also provided. Treatment is provided to both adults and children. °Patients are selected via a lottery and there is often a waiting list. °  °[facility] [address], °[place] ° (336) 763-8833 [email or web address] °  °[facility] [address], [place] (336)723-1848, Ext. 123 Second and Fourth Thursday of each month, opens at 6:30 AM; Clinic ends at 9 AM.  Patients are seen on a first-come first-served basis, and a limited number are seen during each clinic.  ° °[facility] ° [address], [place], [place] (336) 723-7904    Eligibility Requirements °You must have lived in [place], [place], or [place] counties for at least the last three months. °  You cannot be eligible for state or federal sponsored healthcare insurance, including Veterans Administration, Medicaid, or Medicare. °  You generally cannot be eligible for healthcare insurance through your employer.  °  How to apply: °Eligibility screenings are held every Tuesday and Wednesday afternoon from 1:00 pm until 4:00 pm. You do not need an appointment for the interview!  °[facility] [address], [place], [place] 336-631-2330   °[facility]  336-342-8273   °[facility]  336-703-3100   °[facility]  336-570-6415   ° °Behavioral Health Resources in the Community: °Intensive Outpatient Programs °Organization         Address  Phone  Notes  °[facility] [address], [place], [place] 336-878-6098   °[facility] [address], [place], [place] 336-832-9800   °[facility] [address], [place], [place] ° 336-882-2125   °[facility] [address],  °[place], [place] 1-800-853-5163 or 336-641-4981   °Substance Abuse Resources °Organization         Address  Phone  Notes  °[facility]  336-882-2125   °[facility]  336-784-9470   °[facility]  336-285-9073   °[facility]  336-845-3988   °Residential & Outpatient Substance Abuse Program  1-800-659-3381   °Psychological Services °Organization         Address  Phone  Notes  °[facility]  336- 832-9600   °[facility]  336- 378-7881   °[facility] [address], [place] 1-800-853-5163 or 336-641-4981   ° °Mobile Crisis Teams °Organization         Address  Phone  Notes  °[facility], Mobile Crisis Care Unit  1-877-626-1772   °Assertive °[facility] ° [address]  [place], [place] 336-834-9664   °Sharon DeEsch [address] °[place] 336-554-5454   ° °Self-Help/Support Groups °Organization         Address  Phone             Notes  °[facility] [place] - variety of support groups  336- 373-1402 Call for more information  °Narcotics Anonymous (NA), [facility] [address], °[place]  2 meetings at this location  ° °  Residential Treatment Programs °Organization         Address  Phone  Notes  °[facility] [address],    °[place]  1-866-801-8205   °[facility] ° [address], [place], [place] 704-293-8524   °[facility] [address], [place] 336-845-3988 Admissions: 8am-3pm M-F  °[facility] [address]    °[place], [place] 336-841-1104   °[facility] [address], [place], [place] 336-379-7146   °[facility] [address]  °[place], [place] 336-285-9073   °[facility] - Intensive Outpatient [address], [place], [place] 336-852-3033   °[facility] [address]  °[place], [place] 1-877-615-2722 or 336-784-9470   °[facility] [address] [place], [place] 336-227-7417 Accepts Medicaid  °[facility] [address]  °[place] 1-800-659-3381 Substance Abuse/Addiction Treatment  ° °[place] Behavioral Health Resources °Organization         Address  Phone  Notes  °[facility]  (888) 581-9988   °Julie Brannon, PhD [address] [place], [place]   (336) 349-5553 or (336) 951-0000   ° Behavioral   [address] °[place], [place] (336) 349-4454   °[facility] [address], [place], [place] (336) 342-8316 Insurance/Medicaid/sponsorship through [facility]  °[facility] [address]                                    [place], [place] (336) 342-8316 Therapy/tele-psych/case    °[facility] [address]  ° [place], [place] (336) 349-2233    °Dr. Arfeen  (336) 349-4544   °[facility] [place]  [employer] [place] [facility] 1) [address], [place] °2) [address], [place] °3)  [address], [place] (336) 349-3220 °(336) 342-7768 ° °(336) 342-8140   °[place] Child Abuse [facility] (336) 342-1394 or (336) 342-3537 (After Hours)    ° ° ° °Take the prescriptions as directed.  Apply moist heat or ice to the area(s) of discomfort, for 15 minutes at a time, several times per day for the next few days.  Do not fall asleep on a heating or ice pack.  Call your regular medical doctor today to schedule a follow up appointment this week.  Return to the Emergency Department immediately if worsening. ° °

## 2013-10-13 NOTE — ED Notes (Signed)
Patient transported to X-ray 

## 2013-10-13 NOTE — ED Notes (Addendum)
Pt reports that she woke up with center chest pain that radiates to right arm with shortness of breath. Pt reports that she tried "baking soda and water thinking it was gas." Without relief. Pt also reports that her period is not normal and that she has just been spotting. Pt reports she had tubal ligation but her tubes came untied.

## 2013-10-13 NOTE — ED Notes (Signed)
Patient ambulated to bathroom to obtain urine specimen.

## 2013-10-13 NOTE — ED Provider Notes (Signed)
CSN: [id number]     Arrival date & time 10/13/13  1914 History   First MD Initiated Contact with Patient 10/13/13 0945     Chief Complaint  Patient presents with  . Chest Pain  . Fatigue  . Hip Pain      HPI Pt was seen at 0955. Per pt, c/o gradual onset and persistence of constant generalized fatigue for the past 2 weeks. Pt states she "just feels tired and stressed out." Pt also c/o waking up this morning with constant "sore" and "aching" right hip pain and right sided chest wall pain which radiates into her right shoulder. Right hip pain and right sided chest pain both worsen with palpation of the area and body position changes. Pt is also states "my period is lighter than usual" and is concerned regarding possible pregnancy. Denies pelvic/abd pain, no N/V/D, no back pain, no dysuria, no fevers, no palpitations, no SOB/cough, no focal motor weakness, no tingling/numbness in extremities, no injury.     Currently has menses Past Medical History  Diagnosis Date  . Eczema   . Hypothyroidism     "Not on meds since 1996"  . Asthma   . Migraine headache   . Anemia    Past Surgical History  Procedure Laterality Date  . Tubal ligation     Family History  Problem Relation Age of Onset  . Cancer Mother   . Mental illness Mother   . Multiple sclerosis Father   . Hypertension Maternal Grandmother   . Diabetes Maternal Grandfather   . Hypertension Maternal Grandfather    History  Substance Use Topics  . Smoking status: Current Every Day Smoker -- 0.25 packs/day for 6 years    Types: Cigarettes  . Smokeless tobacco: Not on file  . Alcohol Use: No   OB History   Grav Para Term Preterm Abortions TAB SAB Ect Mult Living   7 5 3 2 2  0 2 0 0 5     Review of Systems ROS: Statement: All systems negative except as marked or noted in the HPI; Constitutional: Negative for fever and chills. +generalized fatigue.; ; Eyes: Negative for eye pain, redness and discharge. ; ; ENMT: Negative for  ear pain, hoarseness, nasal congestion, sinus pressure and sore throat. ; ; Cardiovascular:  Negative for palpitations, diaphoresis, dyspnea and peripheral edema. ; ; Respiratory: Negative for cough, wheezing and stridor. ; ; Gastrointestinal: Negative for nausea, vomiting, diarrhea, abdominal pain, blood in stool, hematemesis, jaundice and rectal bleeding. . ; ; Genitourinary: Negative for dysuria, flank pain and hematuria. ; ; Musculoskeletal: +right hip pain, chest wall pain. Negative for back pain and neck pain. Negative for swelling and trauma.; ; Skin: Negative for pruritus, rash, abrasions, blisters, bruising and skin lesion.; ; Neuro: Negative for headache, lightheadedness and neck stiffness. Negative for weakness, altered level of consciousness , altered mental status, extremity weakness, paresthesias, involuntary movement, seizure and syncope.      Allergies  Review of patient's allergies indicates no known allergies.  Home Medications   Prior to Admission medications   Medication Sig Start Date End Date Taking? Authorizing Provider  acetaminophen (TYLENOL) 500 MG tablet Take 500 mg by mouth 2 (two) times daily as needed for headache.   Yes Historical Provider, MD  rizatriptan (MAXALT-MLT) 5 MG disintegrating tablet Take 5 mg by mouth daily as needed for migraine. May repeat in 2 hours if needed   Yes Historical Provider, MD   BP 117/73  Pulse 76  Temp(Src)  98.3 F (36.8 C) (Oral)  Resp 16  Ht 5\' 4"  (1.626 m)  Wt 200 lb (90.719 kg)  BMI 34.31 kg/m2  SpO2 99%  LMP 10/13/2013 Physical Exam 1000: Physical examination:  Nursing notes reviewed; Vital signs and O2 SAT reviewed;  Constitutional: Well developed, Well nourished, Well hydrated, In no acute distress; Head:  Normocephalic, atraumatic; Eyes: EOMI, PERRL, No scleral icterus; ENMT: TM's clear bilat. Mouth and pharynx normal, Mucous membranes moist; Neck: Supple, Full range of motion, No lymphadenopathy; Cardiovascular: Regular  rate and rhythm, No murmur, rub, or gallop; Respiratory: Breath sounds clear & equal bilaterally, No rales, rhonchi, wheezes.  Speaking full sentences with ease, Normal respiratory effort/excursion; Chest: +TTP right parasternal and upper anterior chest wall to anterior right shoulder areas, which reproduces pt's pain. No rash, no deformity, no soft tissue crepitus. Movement normal; Abdomen: Soft, Nontender, Nondistended, Normal bowel sounds; Genitourinary: No CVA tenderness; Extremities: Pulses normal, +right hip tenderness to palp, no edema, no deformity. No calf edema or asymmetry.; Neuro: AA&Ox3, Major CN grossly intact.  Speech clear. No gross focal motor or sensory deficits in extremities. Climbs on and off stretcher easily by herself. Gait steady.; Skin: Color normal, Warm, Dry.   ED Course  Procedures     EKG Interpretation   Date/Time:  Monday October 13 2013 09:28:38 EDT Ventricular Rate:  78 PR Interval:  146 QRS Duration: 76 QT Interval:  392 QTC Calculation: 446 R Axis:   17 Text Interpretation:  Normal sinus rhythm Normal ECG No old tracing to  compare Confirmed by [facility] Hospital[facility]CCMANUS  MD, Nicholos JohnsKATHLEEN [phone]([id number]) on 10/13/2013 9:45:01  AM      MDM  MDM Reviewed: previous chart, nursing note and vitals Reviewed previous: labs Interpretation: labs, ECG and x-ray    Results for orders placed during the hospital encounter of 10/13/13  CBC      Result Value Ref Range   WBC 7.2  4.0 - 10.5 K/uL   RBC 4.20  3.87 - 5.11 MIL/uL   Hemoglobin 11.5 (*) 12.0 - 15.0 g/dL   HCT 91.434.7 (*) 78.236.0 - 95.646.0 %   MCV 82.6  78.0 - 100.0 fL   MCH 27.4  26.0 - 34.0 pg   MCHC 33.1  30.0 - 36.0 g/dL   RDW 21.315.1  08.611.5 - 57.815.5 %   Platelets 325  150 - 400 K/uL  BASIC METABOLIC PANEL      Result Value Ref Range   Sodium 138  137 - 147 mEq/L   Potassium 4.4  3.7 - 5.3 mEq/L   Chloride 104  96 - 112 mEq/L   CO2 22  19 - 32 mEq/L   Glucose, Bld 77  70 - 99 mg/dL   BUN 10  6 - 23 mg/dL   Creatinine, Ser 4.690.79   0.50 - 1.10 mg/dL   Calcium 8.9  8.4 - 62.910.5 mg/dL   GFR calc non Af Amer >90  >90 mL/min   GFR calc Af Amer >90  >90 mL/min  URINALYSIS, ROUTINE W REFLEX MICROSCOPIC      Result Value Ref Range   Color, Urine AMBER (*) YELLOW   APPearance CLOUDY (*) CLEAR   Specific Gravity, Urine 1.026  1.005 - 1.030   pH 6.0  5.0 - 8.0   Glucose, UA NEGATIVE  NEGATIVE mg/dL   Hgb urine dipstick LARGE (*) NEGATIVE   Bilirubin Urine SMALL (*) NEGATIVE   Ketones, ur 15 (*) NEGATIVE mg/dL   Protein, ur 30 (*) NEGATIVE mg/dL  Urobilinogen, UA 1.0  0.0 - 1.0 mg/dL   Nitrite NEGATIVE  NEGATIVE   Leukocytes, UA SMALL (*) NEGATIVE  URINE MICROSCOPIC-ADD ON      Result Value Ref Range   Squamous Epithelial / LPF RARE  RARE   WBC, UA 0-2  <3 WBC/hpf   RBC / HPF TOO NUMEROUS TO COUNT  <3 RBC/hpf   Bacteria, UA FEW (*) RARE  I-STAT TROPOININ, ED      Result Value Ref Range   Troponin i, poc 0.00  0.00 - 0.08 ng/mL   Comment 3           POC URINE PREG, ED      Result Value Ref Range   Preg Test, Ur NEGATIVE  NEGATIVE   Dg Chest 2 View 10/13/2013   CLINICAL DATA:  Right lateral hip pain.  Chest pain.  EXAM: CHEST  2 VIEW  COMPARISON:  None.  FINDINGS: Cardiopericardial silhouette within normal limits. Mediastinal contours normal. Trachea midline. No airspace disease or effusion. Eventration of the right hemidiaphragm is noted anteriorly.  IMPRESSION: No active cardiopulmonary disease.   Electronically Signed   By: Andreas NewportGeoffrey  Lamke M.D.   On: 10/13/2013 10:31   Dg Hip Complete Right 10/13/2013   CLINICAL DATA:  Right lateral hip pain.  EXAM: RIGHT HIP - COMPLETE 2+ VIEW  COMPARISON:  None.  FINDINGS: Pelvic rings appear intact. There is no fracture. Hip joint spaces are preserved. No joint space narrowing or marginal osteophytes.  IMPRESSION: Negative.   Electronically Signed   By: Andreas NewportGeoffrey  Lamke M.D.   On: 10/13/2013 10:32    1240:  Pt currently has menses. Has tol PO well while in the ED without N/V. Has  ambulated with steady/upright gait. Doubt PE as cause for symptoms with low risk [place]. Will tx symptomatically at this time. Pt wants to go home now. Dx and testing d/w pt and family.  Questions answered.  Verb understanding, agreeable to d/c home with outpt f/u.   Laray AngerKathleen M Natika Geyer, DO 10/15/13 [phone]

## 2014-04-27 ENCOUNTER — Encounter (HOSPITAL_COMMUNITY): Payer: Self-pay | Admitting: Emergency Medicine

## 2014-09-17 DIAGNOSIS — Z872 Personal history of diseases of the skin and subcutaneous tissue: Secondary | ICD-10-CM | POA: Insufficient documentation

## 2014-09-17 DIAGNOSIS — Z3202 Encounter for pregnancy test, result negative: Secondary | ICD-10-CM | POA: Insufficient documentation

## 2014-09-17 DIAGNOSIS — E669 Obesity, unspecified: Secondary | ICD-10-CM | POA: Insufficient documentation

## 2014-09-17 DIAGNOSIS — Z720 Tobacco use: Secondary | ICD-10-CM | POA: Insufficient documentation

## 2014-09-17 DIAGNOSIS — M25551 Pain in right hip: Secondary | ICD-10-CM | POA: Insufficient documentation

## 2014-09-17 DIAGNOSIS — G43909 Migraine, unspecified, not intractable, without status migrainosus: Secondary | ICD-10-CM | POA: Insufficient documentation

## 2014-09-17 DIAGNOSIS — Z862 Personal history of diseases of the blood and blood-forming organs and certain disorders involving the immune mechanism: Secondary | ICD-10-CM | POA: Insufficient documentation

## 2014-09-17 DIAGNOSIS — N939 Abnormal uterine and vaginal bleeding, unspecified: Secondary | ICD-10-CM | POA: Insufficient documentation

## 2014-09-17 DIAGNOSIS — J45909 Unspecified asthma, uncomplicated: Secondary | ICD-10-CM | POA: Insufficient documentation

## 2014-09-17 DIAGNOSIS — K5900 Constipation, unspecified: Secondary | ICD-10-CM | POA: Insufficient documentation

## 2014-09-18 ENCOUNTER — Emergency Department (HOSPITAL_COMMUNITY)
Admission: EM | Admit: 2014-09-18 | Discharge: 2014-09-18 | Disposition: A | Discharge status: Home / Self Care | Payer: Self-pay | Attending: Emergency Medicine | Admitting: Emergency Medicine

## 2014-09-18 ENCOUNTER — Emergency Department (HOSPITAL_COMMUNITY): Payer: Self-pay

## 2014-09-18 ENCOUNTER — Encounter (HOSPITAL_COMMUNITY): Payer: Self-pay | Admitting: Emergency Medicine

## 2014-09-18 DIAGNOSIS — M25551 Pain in right hip: Secondary | ICD-10-CM

## 2014-09-18 LAB — POC URINE PREG, ED: Preg Test, Ur: NEGATIVE

## 2014-09-18 LAB — WET PREP, GENITAL
Trich, Wet Prep: NONE SEEN
Yeast Wet Prep HPF POC: NONE SEEN

## 2014-09-18 LAB — CBC WITH DIFFERENTIAL/PLATELET
Basophils Absolute: 0 10*3/uL (ref 0.0–0.1)
Basophils Relative: 0 % (ref 0–1)
EOS PCT: 1 % (ref 0–5)
Eosinophils Absolute: 0.2 10*3/uL (ref 0.0–0.7)
HCT: 39 % (ref 36.0–46.0)
HEMOGLOBIN: 12.9 g/dL (ref 12.0–15.0)
LYMPHS ABS: 4.2 10*3/uL — AB (ref 0.7–4.0)
Lymphocytes Relative: 37 % (ref 12–46)
MCH: 28 pg (ref 26.0–34.0)
MCHC: 33.1 g/dL (ref 30.0–36.0)
MCV: 84.6 fL (ref 78.0–100.0)
MONO ABS: 0.7 10*3/uL (ref 0.1–1.0)
MONOS PCT: 6 % (ref 3–12)
Neutro Abs: 6.3 10*3/uL (ref 1.7–7.7)
Neutrophils Relative %: 56 % (ref 43–77)
Platelets: 368 10*3/uL (ref 150–400)
RBC: 4.61 MIL/uL (ref 3.87–5.11)
RDW: 14.7 % (ref 11.5–15.5)
WBC: 11.4 10*3/uL — AB (ref 4.0–10.5)

## 2014-09-18 LAB — COMPREHENSIVE METABOLIC PANEL
ALT: 43 U/L — AB (ref 0–35)
AST: 39 U/L — ABNORMAL HIGH (ref 0–37)
Albumin: 3.7 g/dL (ref 3.5–5.2)
Alkaline Phosphatase: 98 U/L (ref 39–117)
Anion gap: 5 (ref 5–15)
BUN: 11 mg/dL (ref 6–23)
CHLORIDE: 105 mmol/L (ref 96–112)
CO2: 26 mmol/L (ref 19–32)
Calcium: 9.3 mg/dL (ref 8.4–10.5)
Creatinine, Ser: 0.95 mg/dL (ref 0.50–1.10)
GFR calc non Af Amer: 76 mL/min — ABNORMAL LOW (ref >90)
GFR, EST AFRICAN AMERICAN: 88 mL/min — AB (ref >90)
Glucose, Bld: 99 mg/dL (ref 70–99)
Potassium: 4.1 mmol/L (ref 3.5–5.1)
Sodium: 136 mmol/L (ref 135–145)
Total Bilirubin: 0.4 mg/dL (ref 0.3–1.2)
Total Protein: 7.7 g/dL (ref 6.0–8.3)

## 2014-09-18 LAB — GC/CHLAMYDIA PROBE AMP (~~LOC~~) NOT AT ARMC
Chlamydia: NEGATIVE
Neisseria Gonorrhea: NEGATIVE

## 2014-09-18 LAB — LIPASE, BLOOD: Lipase: 52 U/L (ref 11–59)

## 2014-09-18 LAB — HIV ANTIBODY (ROUTINE TESTING W REFLEX): HIV SCREEN 4TH GENERATION: NONREACTIVE

## 2014-09-18 NOTE — Discharge Instructions (Signed)
Pelvic Pain Ms. Hardrick-Chambers, your ultrasound shows a cyst on your right ovary.  This may be causing your pain. Continue to take motrin or tylenol at home as needed for pain. Follow-up with OB/GYN within 3 days for continued management. If symptoms worsen back to the emergency department immediately. Thank you. Pelvic pain is pain felt below the belly button and between your hips. It can be caused by many different things. It is important to get help right away. This is especially true for severe, sharp, or unusual pain that comes on suddenly.  HOME CARE  Only take medicine as told by your doctor.  Rest as told by your doctor.  Eat a healthy diet, such as fruits, vegetables, and lean meats.  Drink enough fluids to keep your pee (urine) clear or pale yellow, or as told.  Avoid sex (intercourse) if it causes pain.  Apply warm or cold packs to your lower belly (abdomen). Use the type of pack that helps the pain.  Avoid situations that cause you stress.  Keep a journal to track your pain. Write down:  When the pain started.  Where it is located.  If there are things that seem to be related to the pain, such as food or your period.  Follow up with your doctor as told. GET HELP RIGHT AWAY IF:   You have heavy bleeding from the vagina.  You have more pelvic pain.  You feel lightheaded or pass out (faint).  You have chills.  You have pain when you pee or have blood in your pee.  You cannot stop having watery poop (diarrhea).  You cannot stop throwing up (vomiting).  You have a fever or lasting symptoms for more than 3 days.  You have a fever and your symptoms suddenly get worse.  You are being physically or sexually abused.  Your medicine does not help your pain.  You have fluid (discharge) coming from your vagina that is not normal. MAKE SURE YOU:  Understand these instructions.  Will watch your condition.  Will get help if you are not doing well or get  worse. Document Released: 11/29/2007 Document Revised: 12/12/2011 Document Reviewed: 10/02/2011 [facility] Patient Information 2015 [place] [place]. This information is not intended to replace advice given to you by your health care provider. Make sure you discuss any questions you have with your health care provider.

## 2014-09-18 NOTE — ED Notes (Signed)
Pt presents with right lower abdominal cramping throughout the day, admits to scant bloody discharge.  Pt states "I had a faint positive pregnancy test at home".  Admits to nausea.

## 2014-09-18 NOTE — ED Provider Notes (Signed)
CSN: [id number]     Arrival date & time 09/17/14  2353 History  This chart was scribed for Tomasita Crumble, MD by Evon Slack, ED Scribe. This patient was seen in room A06C/A06C and the patient's care was started at 12:38 AM.    Chief Complaint  Patient presents with  . Abdominal Cramping    Patient is a 39 y.o. female presenting with cramps. The history is provided by the patient. No language interpreter was used.  Abdominal Cramping Associated symptoms include abdominal pain and headaches.   HPI Comments: Cheryl Gilbert is a 39 y.o. female who presents to the Emergency Department complaining of right sided low abdominal pain described as cramping onset 3 hours PTA. Pt states that the pain is beginning to radiate to her back. Pt states she has associated appetite change, vaginal bleeding, nausea, HA and breast tenderness. Pt states that the vaginal bleeding began as spotting but states she also has a Hx of irregular menstrual cycles. Pt reports constipaition as well. Pt states that she did take pregnancy test but is unsure if she read the results correctly. Pt states that her pain sometimes improves with position changes or drinking water. Pt states that she last had unprotected sex 2 weeks prior. Denies vaginal discharge, hematuria or vomiting. Pt states she has a Hx of cyst on her ovaries.    Past Medical History  Diagnosis Date  . Eczema   . Hypothyroidism     "Not on meds since 1996"  . Asthma   . Migraine headache   . Anemia    Past Surgical History  Procedure Laterality Date  . Tubal ligation     Family History  Problem Relation Age of Onset  . Cancer Mother   . Mental illness Mother   . Multiple sclerosis Father   . Hypertension Maternal Grandmother   . Diabetes Maternal Grandfather   . Hypertension Maternal Grandfather    History  Substance Use Topics  . Smoking status: Current Every Day Smoker -- 0.25 packs/day for 6 years    Types: Cigarettes  .  Smokeless tobacco: Not on file  . Alcohol Use: No   OB History    Gravida Para Term Preterm AB TAB SAB Ectopic Multiple Living   0 2 0 0 5      Review of Systems  Constitutional: Positive for appetite change.  Gastrointestinal: Positive for nausea, abdominal pain and constipation.  Genitourinary: Positive for vaginal bleeding. Negative for hematuria.  Neurological: Positive for headaches.  All other systems reviewed and are negative.    Allergies  Review of patient's allergies indicates no known allergies.  Home Medications   Prior to Admission medications   Medication Sig Start Date End Date Taking? Authorizing Provider  acetaminophen (TYLENOL) 500 MG tablet Take 500 mg by mouth 2 (two) times daily as needed for headache.   Yes Historical Provider, MD  rizatriptan (MAXALT-MLT) 5 MG disintegrating tablet Take 5 mg by mouth daily as needed for migraine. May repeat in 2 hours if needed   Yes Historical Provider, MD  HYDROcodone-acetaminophen (NORCO/VICODIN) 5-325 MG per tablet 1 or 2 tabs PO q6 hours prn pain Patient not taking: Reported on 09/18/2014 10/13/13   Samuel Jester, DO  methocarbamol (ROBAXIN) 500 MG tablet Take 2 tablets (1,000 mg total) by mouth 4 (four) times daily as needed for muscle spasms (muscle spasm/pain). Patient not taking: Reported on 09/18/2014 10/13/13   Samuel Jester, DO  naproxen (NAPROSYN) 250 MG  tablet Take 1 tablet (250 mg total) by mouth 2 (two) times daily with a meal. Patient not taking: Reported on 09/18/2014 10/13/13   Samuel JesterKathleen McManus, DO   BP 127/82 mmHg  Pulse 81  Temp(Src) 98 F (36.7 C) (Oral)  Resp 17  Ht 5\' 3"  (1.6 m)  Wt 210 lb (95.255 kg)  BMI 37.21 kg/m2  SpO2 99%  LMP 08/11/2014   Physical Exam  Constitutional: She is oriented to person, place, and time. She appears well-developed and well-nourished. No distress.  Obese.   HENT:  Head: Normocephalic and atraumatic.  Nose: Nose normal.  Mouth/Throat: Oropharynx  is clear and moist. No oropharyngeal exudate.  Eyes: Conjunctivae and EOM are normal. Pupils are equal, round, and reactive to light. No scleral icterus.  Neck: Normal range of motion. Neck supple. No JVD present. No tracheal deviation present. No thyromegaly present.  Cardiovascular: Normal rate, regular rhythm and normal heart sounds.  Exam reveals no gallop and no friction rub.   No murmur heard. Pulmonary/Chest: Effort normal and breath sounds normal. No respiratory distress. She has no wheezes. She exhibits no tenderness.  Abdominal: Soft. Bowel sounds are normal. She exhibits no distension and no mass. There is tenderness in the right lower quadrant. There is no rebound and no guarding.  Genitourinary: Cervix exhibits no motion tenderness. Right adnexum displays no tenderness. Left adnexum displays no tenderness.  Mild amount of blood in vagina vault, no discharge.   Musculoskeletal: Normal range of motion. She exhibits no edema or tenderness.  Lymphadenopathy:    She has no cervical adenopathy.  Neurological: She is alert and oriented to person, place, and time. No cranial nerve deficit. She exhibits normal muscle tone.  Skin: Skin is warm and dry. No rash noted. No erythema. No pallor.  Nursing note and vitals reviewed.   ED Course  Procedures (including critical care time) DIAGNOSTIC STUDIES: Oxygen Saturation is 99% on RA, normal by my interpretation.    COORDINATION OF CARE: 12:42 AM-Discussed treatment plan which includes pelvic exam with pt at bedside and pt agreed to plan.     Labs Review Labs Reviewed  WET PREP, GENITAL - Abnormal; Notable for the following:    Clue Cells Wet Prep HPF POC FEW (*)    WBC, Wet Prep HPF POC FEW (*)    All other components within normal limits  CBC WITH DIFFERENTIAL/PLATELET - Abnormal; Notable for the following:    WBC 11.4 (*)    Lymphs Abs 4.2 (*)    All other components within normal limits  COMPREHENSIVE METABOLIC PANEL -  Abnormal; Notable for the following:    AST 39 (*)    ALT 43 (*)    GFR calc non Af Amer 76 (*)    GFR calc Af Amer 88 (*)    All other components within normal limits  LIPASE, BLOOD  HIV ANTIBODY (ROUTINE TESTING)  POC URINE PREG, ED  GC/CHLAMYDIA PROBE AMP ([place])    Imaging Review [place] Transvaginal Non-ob  09/18/2014   CLINICAL DATA:  Right-sided pelvic pain.  EXAM: TRANSABDOMINAL AND TRANSVAGINAL ULTRASOUND OF PELVIS  TECHNIQUE: Both transabdominal and transvaginal ultrasound examinations of the pelvis were performed. Transabdominal technique was performed for global imaging of the pelvis including uterus, ovaries, adnexal regions, and pelvic cul-de-sac. It was necessary to proceed with endovaginal exam following the transabdominal exam to visualize the uterus, ovaries and adnexa.  COMPARISON:  None  FINDINGS: Uterus  Measurements: 9.6 x 4.8 x 5.9 cm. No fibroids  or other mass visualized. There is mild coarsening of myometrial echotexture without discrete lesion.  Endometrium  Thickness: 7.8 mm.  No focal abnormality visualized.  Right ovary  Measurements: 4.5 x 4.4 x 3.8 cm. The right ovary contains a 3.6 x 4.0 x 3.5 cm simple cyst. No internal septations. Blood flow is seen to the adjacent ovarian parenchyma  Left ovary  Not visualized.  Other findings  No free fluid.  IMPRESSION: 1. Simple right ovarian cyst measuring 4 cm. Normal blood flow to the ovarian parenchyma. 2. Normal-sized uterus with mildly heterogeneous and coarsened myometrial echotexture. No discrete fibroid is seen.   Electronically Signed   By: Rubye Oaks M.D.   On: 09/18/2014 04:00   US Pelvis Complete  09/18/2014   CLINICAL DATA:  Right-sided pelvic pain.  EXAM: TRANSABDOMINAL AND TRANSVAGINAL ULTRASOUND OF PELVIS  TECHNIQUE: Both transabdominal and transvaginal ultrasound examinations of the pelvis were performed. Transabdominal technique was performed for global imaging of the pelvis including uterus, ovaries,  adnexal regions, and pelvic cul-de-sac. It was necessary to proceed with endovaginal exam following the transabdominal exam to visualize the uterus, ovaries and adnexa.  COMPARISON:  None  FINDINGS: Uterus  Measurements: 9.6 x 4.8 x 5.9 cm. No fibroids or other mass visualized. There is mild coarsening of myometrial echotexture without discrete lesion.  Endometrium  Thickness: 7.8 mm.  No focal abnormality visualized.  Right ovary  Measurements: 4.5 x 4.4 x 3.8 cm. The right ovary contains a 3.6 x 4.0 x 3.5 cm simple cyst. No internal septations. Blood flow is seen to the adjacent ovarian parenchyma  Left ovary  Not visualized.  Other findings  No free fluid.  IMPRESSION: 1. Simple right ovarian cyst measuring 4 cm. Normal blood flow to the ovarian parenchyma. 2. Normal-sized uterus with mildly heterogeneous and coarsened myometrial echotexture. No discrete fibroid is seen.   Electronically Signed   By: Rubye Oaks M.D.   On: 09/18/2014 04:00     EKG Interpretation None      MDM   Final diagnoses:  None   Patient as well as emergency department for right lower quadrant abdominal pain sudden onset around 9 PM. I doubt appendicitis due to the acute onset of this condition. Patient also admits to vaginal bleeding and a possibly positive pregnancy test. Here her pregnancy test is negative. However this could be an early pregnancy, will get a transvaginal ultrasound to evaluate for any fibroids, ovarian torsion, or less likely ectopic pregnancy.. Pelvic exam is unremarkable other than mild amount of blood in the vaginal vault. Patient is not requesting any pain medication at this time.  Ultrasound only reveals right ovarian cyst that is 4 cm, there is no torsion, there is no ectopic pregnancy.  Patient appears comfortable in the room and in no acute distress. I recommended the patient to take Motrin or Tylenol as needed for pain and follow-up with an OB/GYN physician for continued management within 3  days. Her vital signs were within normal limits and she is safe for discharge.   I personally performed the services described in this documentation, which was scribed in my presence. The recorded information has been reviewed and is accurate.       Tomasita Crumble, MD 09/18/14 [phone]

## 2015-12-30 ENCOUNTER — Emergency Department (HOSPITAL_COMMUNITY): Payer: Self-pay

## 2015-12-30 ENCOUNTER — Emergency Department (HOSPITAL_COMMUNITY)
Admission: EM | Admit: 2015-12-30 | Discharge: 2015-12-30 | Disposition: A | Discharge status: Home / Self Care | Payer: Self-pay | Attending: Emergency Medicine | Admitting: Emergency Medicine

## 2015-12-30 ENCOUNTER — Encounter (HOSPITAL_COMMUNITY): Payer: Self-pay | Admitting: Emergency Medicine

## 2015-12-30 DIAGNOSIS — M25532 Pain in left wrist: Secondary | ICD-10-CM | POA: Insufficient documentation

## 2015-12-30 DIAGNOSIS — E039 Hypothyroidism, unspecified: Secondary | ICD-10-CM | POA: Insufficient documentation

## 2015-12-30 DIAGNOSIS — J45909 Unspecified asthma, uncomplicated: Secondary | ICD-10-CM | POA: Insufficient documentation

## 2015-12-30 DIAGNOSIS — F17210 Nicotine dependence, cigarettes, uncomplicated: Secondary | ICD-10-CM | POA: Insufficient documentation

## 2015-12-30 NOTE — Discharge Instructions (Signed)
Read the information below.   Your x-rays are this time are re-assuring. A splint was applied to your wrist for stability and pain relief. Take ibuprofen 400mg  q6hrs or tylenol 650mg  q6hrs for pain and inflammation control.  I encourage you to follow up with an orthopedic given the length of time you have been experiencing symptoms. [facility], his contact information is provided.  You may return to the Emergency Department at any time for worsening condition or any new symptoms that concern you. Return to ED if you develop worsening symptoms, joint warmth, redness, fever, or numbness/weakness in your hand.

## 2015-12-30 NOTE — ED Notes (Signed)
Patient arrives with complaint of left wrist pain. States onset 2 months ago. Denies acute injury. 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 States she moved a mattress yesterday and the pain became worse. Pain is on the radial side of the wrist.

## 2015-12-30 NOTE — ED Provider Notes (Signed)
CSN: [id number]     Arrival date & time 12/30/15  2103 History  By signing my name below, I, [facility], attest that this documentation has been prepared under the direction and in the presence of Arvilla MeresAshley Meyer, PA-C. Electronically Signed: Randell PatientMarrissa Gilbert, ED Scribe. 12/30/2015. 11:08 PM.   Chief Complaint  Gilbert presents with  . Wrist Pain    The history is provided by the Gilbert. No language interpreter was used.    HPI Comments: Cheryl RueLakrecia Gilbert is a 40 y.o. female who presents to the Emergency Department complaining of intermittent, 5/10, gradually improving left wrist pain onset 2 months ago, worse in the past day. She describes the pain as throbbing and aching in nature that progresses to sharp and shooting with use of the wrist. Pt states that she injured her left wrist 2 months ago (unknown mechanism) but that yesterday she was moving a mattress and lifted her 15 lb grandson followed by an increase in the severity of her pain to 10/10 in the affected wrist. She reports associated difficultly moving the wrist secondary to pain. Pain is worse with movement and use of the wrist. She notes that she applied ice and a wrist brace with relief of her swelling only and took hydrocodone which made her fall asleep but is unsure if this medication provided any relief . 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 She reports that her daughter sees orthopedist Dr. Gerda Gilbert. Denies having a PCP currently. Denies fever, chills, nightsweats, numbness, weakness, redness, warmth, or left wrist swelling currently.  Past Medical History  Diagnosis Date  . Eczema   . Hypothyroidism     "Not on meds since 1996"  . Asthma   . Migraine headache   . Anemia    Past Surgical History  Procedure Laterality Date  . Tubal ligation     Family History  Problem Relation Age of Onset  . Cancer Mother   . Mental illness Mother   . Multiple sclerosis  Father   . Hypertension Maternal Grandmother   . Diabetes Maternal Grandfather   . Hypertension Maternal Grandfather    Social History  Substance Use Topics  . Smoking status: Current Every Day Smoker -- 0.25 packs/day for 6 years    Types: Cigarettes  . Smokeless tobacco: None  . Alcohol Use: No   OB History    Gravida Para Term Preterm AB TAB SAB Ectopic Multiple Living   7 5 3 2 2  0 2 0 0 5     Review of Systems  Constitutional: Negative for fever, chills and diaphoresis.  Musculoskeletal: Positive for arthralgias (left wrist). Negative for joint swelling (none currently).  Skin: Negative for color change.  Neurological: Negative for weakness and numbness.      Allergies  Review of Gilbert's allergies indicates no known allergies.  Home Medications   Prior to Admission medications   Medication Sig Start Date End Date Taking? Authorizing Provider  acetaminophen (TYLENOL) 500 MG tablet Take 500 mg by mouth 2 (two) times daily as needed for headache.    Historical Provider, MD  HYDROcodone-acetaminophen (NORCO/VICODIN) 5-325 MG per tablet 1 or 2 tabs PO q6 hours prn pain Gilbert not taking: Reported on 09/18/2014 10/13/13   Samuel JesterKathleen McManus, DO  methocarbamol (ROBAXIN) 500 MG tablet Take 2 tablets (1,000 mg total) by mouth 4 (four) times daily as needed for muscle spasms (muscle spasm/pain). Gilbert not taking: Reported on 09/18/2014 10/13/13   Samuel JesterKathleen McManus, DO  naproxen (NAPROSYN) 250 MG tablet Take 1 tablet (250 mg total) by mouth 2 (two) times daily with a meal. Gilbert not taking: Reported on 09/18/2014 10/13/13   Samuel JesterKathleen McManus, DO  rizatriptan (MAXALT-MLT) 5 MG disintegrating tablet Take 5 mg by mouth daily as needed for migraine. May repeat in 2 hours if needed    Historical Provider, MD   BP 129/73 mmHg  Pulse 73  Temp(Src) 98.1 F (36.7 C) (Oral)  Resp 16  Ht 5\' 3"  (1.6 m)  Wt 99.791 kg  BMI 38.98 kg/m2  SpO2 98%  LMP 12/16/2015 (Exact Date) Physical Exam   Constitutional: She appears well-developed and well-nourished. No distress.  HENT:  Head: Normocephalic and atraumatic.  Eyes: Conjunctivae are normal. No scleral icterus.  Neck: Normal range of motion.  Pulmonary/Chest: Effort normal. No respiratory distress.  Musculoskeletal: She exhibits tenderness.  No warmth, redness, or appreciable swelling of left wrist. Pain in anatomical snuffbox. Mild left thenar atrophy noted. Active ROM of wrist intact, pain with left ulnar deviation. Pain with active ROM of left thumb. Positive Finkelsteins on the left. Strength and sensation intact. Capillary refill <3sec.   Neurological: She is alert.  Skin: Skin is warm and dry. She is not diaphoretic. No erythema.  Psychiatric: She has a normal mood and affect. Her behavior is normal.  Nursing note and vitals reviewed.   ED Course  Procedures   DIAGNOSTIC STUDIES: Oxygen Saturation is 100% on RA, normal by my interpretation.    COORDINATION OF CARE: 10:49 PM Discussed results of left wrist imaging. Will order left wrist splint. Will provide pt with a referral to an orthopedist. Advised pt to follow-up with orthopedist. Discussed treatment plan with pt at bedside and pt agreed to plan.   Imaging Review Dg Wrist Complete Left  12/30/2015  CLINICAL DATA:  Radial left wrist pain for 2 months. EXAM: LEFT WRIST - COMPLETE 3+ VIEW COMPARISON:  None. FINDINGS: There is no evidence of fracture or dislocation. There is no evidence of arthropathy or other focal bone abnormality. Soft tissues are unremarkable. IMPRESSION: Negative radiographs of the left wrist. Electronically Signed   By: Rubye OaksMelanie  Ehinger M.D.   On: 12/30/2015 21:53   I have personally reviewed and evaluated these images and lab results as part of my medical decision-making.   MDM   Final diagnoses:  Left wrist pain   Pt is afebrile and nontoxic appearing in NAD. Vital signs are stable. Physical exam remarkable for tenderness to palpation of  radial aspect of left wrist, anatomical snuffbox tenderness, and positive Finkelstein's test. Neurovascularly intact. Negative x-ray. Given history and chronicity suspect tendinitis - ?de Quervain tendonopathy. Discussed results with Gilbert. Thumb spica splint placed. Symptomatic management discussed. Encouraged follow-up with orthopedics. Gilbert [facility]. Discussed return precautions. Gilbert voiced understanding and is agreeable.  I personally performed the services described in this documentation, which was scribed in my presence. The recorded information has been reviewed and is accurate.    Herminio Commonsshley Laurel WhitesburgMeyer, PA-C 12/31/15 0244  Richardean Canalavid H Yao, MD 12/31/15 [id number]

## 2016-02-14 ENCOUNTER — Encounter (HOSPITAL_COMMUNITY): Payer: Self-pay | Admitting: Nurse Practitioner

## 2016-02-14 ENCOUNTER — Emergency Department (HOSPITAL_COMMUNITY)
Admission: EM | Admit: 2016-02-14 | Discharge: 2016-02-14 | Disposition: A | Discharge status: Home / Self Care | Payer: Self-pay | Attending: Emergency Medicine | Admitting: Emergency Medicine

## 2016-02-14 ENCOUNTER — Emergency Department (HOSPITAL_COMMUNITY): Payer: Self-pay

## 2016-02-14 DIAGNOSIS — Y999 Unspecified external cause status: Secondary | ICD-10-CM | POA: Insufficient documentation

## 2016-02-14 DIAGNOSIS — Y9389 Activity, other specified: Secondary | ICD-10-CM | POA: Insufficient documentation

## 2016-02-14 DIAGNOSIS — E039 Hypothyroidism, unspecified: Secondary | ICD-10-CM | POA: Insufficient documentation

## 2016-02-14 DIAGNOSIS — M25532 Pain in left wrist: Secondary | ICD-10-CM | POA: Insufficient documentation

## 2016-02-14 DIAGNOSIS — X509XXA Other and unspecified overexertion or strenuous movements or postures, initial encounter: Secondary | ICD-10-CM | POA: Insufficient documentation

## 2016-02-14 DIAGNOSIS — Z79899 Other long term (current) drug therapy: Secondary | ICD-10-CM | POA: Insufficient documentation

## 2016-02-14 DIAGNOSIS — Y92009 Unspecified place in unspecified non-institutional (private) residence as the place of occurrence of the external cause: Secondary | ICD-10-CM | POA: Insufficient documentation

## 2016-02-14 DIAGNOSIS — F17210 Nicotine dependence, cigarettes, uncomplicated: Secondary | ICD-10-CM | POA: Insufficient documentation

## 2016-02-14 DIAGNOSIS — J45909 Unspecified asthma, uncomplicated: Secondary | ICD-10-CM | POA: Insufficient documentation

## 2016-02-14 MED ORDER — NAPROXEN 500 MG PO TABS: 500.0000 mg | ORAL_TABLET | Freq: Two times a day (BID) | ORAL | 0 refills | Status: DC

## 2016-02-14 MED ORDER — IBUPROFEN 200 MG PO TABS
600.0000 mg | ORAL_TABLET | Freq: Once | ORAL | Status: AC
  Administered 2016-02-14: 600 mg via ORAL
  Filled 2016-02-14: qty 1

## 2016-02-14 NOTE — ED Provider Notes (Signed)
[facility]-EMERGENCY DEPT Provider Note   CSN: [id number] Arrival date & time: 02/14/16  1456  By signing my name below, I, [name], attest that this documentation has been prepared under the direction and in the presence of Felicie Morn, NP-C. Electronically Signed: [name], ED Scribe. 02/14/16. 4:36 PM.  History   Chief Complaint Chief Complaint  Patient presents with  . Wrist Injury   The history is provided by the patient. No language interpreter was used.  Wrist Injury   The incident occurred 3 to 5 hours ago. The incident occurred at home. The injury mechanism was torsion. The pain is present in the left wrist. The quality of the pain is described as aching. The pain is moderate. The pain has been constant since the incident. Pertinent negatives include no fever and no malaise/fatigue. The symptoms are aggravated by movement and palpation. She has tried nothing for the symptoms.  HPI Comments: Cheryl Gilbert is a 40 y.o. female who presents to the Emergency Department complaining of a left wrist injury onset earlier this morning, approximately 5 hours ago. She reports associated swelling. Pt reports that she was untwisting a hose when she felt a "pop" in the wrist. She began to have pain after hearing the pop. Pt was seen 3 weeks ago and diagnosed with a hairline fracture in the same arm. She has a splint that she was provided with last visit, but states it no longer fits her. She has been taking Tylenol for pain to no relief. She denies fever, wound, numbness, or weakness.   Past Medical History:  Diagnosis Date  . Anemia   . Asthma   . Eczema   . Hypothyroidism    "Not on meds since 1996"  . Migraine headache     There are no active problems to display for this patient.   Past Surgical History:  Procedure Laterality Date  . TUBAL LIGATION      OB History    Gravida Para Term Preterm AB Living   7 5 3 2 2 5    SAB TAB Ectopic Multiple Live Births   2  0 0 0 5       Home Medications    Prior to Admission medications   Medication Sig Start Date End Date Taking? Authorizing Provider  acetaminophen (TYLENOL) 500 MG tablet Take 500 mg by mouth 2 (two) times daily as needed for headache.    Historical Provider, MD  HYDROcodone-acetaminophen (NORCO/VICODIN) 5-325 MG per tablet 1 or 2 tabs PO q6 hours prn pain Patient not taking: Reported on 09/18/2014 10/13/13   Samuel Jester, DO  methocarbamol (ROBAXIN) 500 MG tablet Take 2 tablets (1,000 mg total) by mouth 4 (four) times daily as needed for muscle spasms (muscle spasm/pain). Patient not taking: Reported on 09/18/2014 10/13/13   Samuel Jester, DO  naproxen (NAPROSYN) 250 MG tablet Take 1 tablet (250 mg total) by mouth 2 (two) times daily with a meal. Patient not taking: Reported on 09/18/2014 10/13/13   Samuel Jester, DO  rizatriptan (MAXALT-MLT) 5 MG disintegrating tablet Take 5 mg by mouth daily as needed for migraine. May repeat in 2 hours if needed    Historical Provider, MD    Family History Family History  Problem Relation Age of Onset  . Cancer Mother   . Mental illness Mother   . Multiple sclerosis Father   . Hypertension Maternal Grandmother   . Diabetes Maternal Grandfather   . Hypertension Maternal Grandfather     Social History  Social History  Substance Use Topics  . Smoking status: Current Every Day Smoker    Packs/day: 0.25    Years: 6.00    Types: Cigarettes  . Smokeless tobacco: Not on file  . Alcohol use No     Allergies   Review of patient's allergies indicates no known allergies.   Review of Systems Review of Systems  Constitutional: Negative for fever and malaise/fatigue.  Musculoskeletal: Positive for arthralgias and joint swelling.  Skin: Negative for wound.  Neurological: Negative for weakness and numbness.  All other systems reviewed and are negative.    Physical Exam Updated Vital Signs BP 136/76 (BP Location: Right Arm)   Pulse 89    Temp 98 F (36.7 C) (Oral)   Resp 18   Ht 5\' 3"  (1.6 m)   Wt 238 lb 3 oz (108 kg)   SpO2 100%   BMI 42.19 kg/m   Physical Exam  Constitutional: She is oriented to person, place, and time. She appears well-developed and well-nourished.  HENT:  Head: Normocephalic and atraumatic.  Eyes: Conjunctivae and EOM are normal. Pupils are equal, round, and reactive to light.  Neck: Normal range of motion. Neck supple.  Cardiovascular: Normal rate and regular rhythm.   Pulmonary/Chest: Effort normal and breath sounds normal.  Musculoskeletal: Normal range of motion.       Left wrist: She exhibits tenderness.  Tenderness to the lateral aspect of the left wrist; sensation intact; limited ROM due to pain; no strength deficit  Neurological: She is alert and oriented to person, place, and time.  Skin: Skin is warm and dry.  Psychiatric: She has a normal mood and affect. Her behavior is normal.  Nursing note and vitals reviewed.    ED Treatments / Results  DIAGNOSTIC STUDIES: Oxygen Saturation is 100% on RA, normal by my interpretation.    COORDINATION OF CARE: 4:32 PM-Discussed treatment plan which includes x-ray with pt at bedside and pt agreed to plan.    Labs (all labs ordered are listed, but only abnormal results are displayed) Labs Reviewed - No data to display  EKG  EKG Interpretation None       Radiology Dg Wrist Complete Left  Result Date: 02/14/2016 CLINICAL DATA:  Left wrist pain beginning this morning after the patient was run twisting a hose. Initial encounter. EXAM: LEFT WRIST - COMPLETE 3+ VIEW COMPARISON:  Plain films left wrist 12/30/2015. FINDINGS: There is no evidence of fracture or dislocation. There is no evidence of arthropathy or other focal bone abnormality. Soft tissues are unremarkable. IMPRESSION: Negative exam. Electronically Signed   By: Drusilla Kannerhomas  Dalessio M.D.   On: 02/14/2016 15:28    Procedures Procedures (including critical care time)  Medications  Ordered in ED Medications - No data to display   Initial Impression / Assessment and Plan / ED Course  I have reviewed the triage vital signs and the nursing notes.  Pertinent labs & imaging results that were available during my care of the patient were reviewed by me and considered in my medical decision making (see chart for details).  Clinical Course      Final Clinical Impressions(s) / ED Diagnoses   Patient X-Ray negative for obvious fracture or dislocation. Pain managed in ED. Pt advised to follow up with orthopedics if symptoms persist. Patient given brace while in ED, conservative therapy recommended and discussed. Patient will be dc home & is agreeable with above plan.  Final diagnoses:  None  Left wrist pain.  I personally  performed the services described in this documentation, which was scribed in my presence. The recorded information has been reviewed and is accurate.   New Prescriptions New Prescriptions   No medications on file     Felicie MornDavid Tarisa Paola, NP 02/15/16 [id number]    Nelva Nayobert Beaton, MD 02/16/16 1349

## 2016-02-14 NOTE — ED Triage Notes (Signed)
She c/o L wrist pain since untwisting a hose this morning. She heard a popping noise at onset of pain. she took tylenol with some relief but pain is returning. Cms intact

## 2017-07-23 ENCOUNTER — Other Ambulatory Visit: Payer: Self-pay

## 2017-07-23 ENCOUNTER — Encounter (HOSPITAL_COMMUNITY): Payer: Self-pay | Admitting: Nurse Practitioner

## 2017-07-23 DIAGNOSIS — R42 Dizziness and giddiness: Secondary | ICD-10-CM | POA: Insufficient documentation

## 2017-07-23 DIAGNOSIS — Z5321 Procedure and treatment not carried out due to patient leaving prior to being seen by health care provider: Secondary | ICD-10-CM | POA: Insufficient documentation

## 2017-07-23 LAB — CBC
HCT: 28.9 % — ABNORMAL LOW (ref 36.0–46.0)
Hemoglobin: 9.2 g/dL — ABNORMAL LOW (ref 12.0–15.0)
MCH: 24.4 pg — ABNORMAL LOW (ref 26.0–34.0)
MCHC: 31.8 g/dL (ref 30.0–36.0)
MCV: 76.7 fL — ABNORMAL LOW (ref 78.0–100.0)
PLATELETS: 308 10*3/uL (ref 150–400)
RBC: 3.77 MIL/uL — ABNORMAL LOW (ref 3.87–5.11)
RDW: 15.9 % — AB (ref 11.5–15.5)
WBC: 7.8 10*3/uL (ref 4.0–10.5)

## 2017-07-23 LAB — URINALYSIS, ROUTINE W REFLEX MICROSCOPIC
Bilirubin Urine: NEGATIVE
Glucose, UA: NEGATIVE mg/dL
Hgb urine dipstick: NEGATIVE
KETONES UR: 5 mg/dL — AB
Leukocytes, UA: NEGATIVE
NITRITE: NEGATIVE
PROTEIN: NEGATIVE mg/dL
Specific Gravity, Urine: 1.02 (ref 1.005–1.030)
pH: 5 (ref 5.0–8.0)

## 2017-07-23 LAB — BASIC METABOLIC PANEL
Anion gap: 10 (ref 5–15)
BUN: 6 mg/dL (ref 6–20)
CALCIUM: 9 mg/dL (ref 8.9–10.3)
CO2: 21 mmol/L — ABNORMAL LOW (ref 22–32)
CREATININE: 0.72 mg/dL (ref 0.44–1.00)
Chloride: 104 mmol/L (ref 101–111)
GFR calc Af Amer: >60 mL/min (ref >60)
Glucose, Bld: 99 mg/dL (ref 65–99)
Potassium: 3.5 mmol/L (ref 3.5–5.1)
SODIUM: 135 mmol/L (ref 135–145)

## 2017-07-23 LAB — I-STAT BETA HCG BLOOD, ED (MC, WL, AP ONLY): I-stat hCG, quantitative: <5 m[IU]/mL (ref <5)

## 2017-07-23 NOTE — ED Provider Notes (Signed)
Patient placed in Quick Look pathway, seen and evaluated   Chief Complaint: Dizziness   HPI:   42 year old female presents today with complaints of dizziness.  Patient notes last week she was feeling  ROS: Dizziness (one)  Physical Exam:   Gen: No distress  Neuro: Awake and Alert  Skin: Warm    Focused Exam: Cardiac regular rate and rhythm no acute neurological deficits   Initiation of care has begun. The patient has been counseled on the process, plan, and necessity for staying for the completion/evaluation, and the remainder of the medical screening examination    Rosalio LoudHedges, Belle Charlie, PA-C 07/23/17 1559    Cardama, Amadeo GarnetPedro Eduardo, MD 07/23/17 2117

## 2017-07-23 NOTE — ED Notes (Signed)
Results reviewed

## 2017-07-23 NOTE — ED Triage Notes (Signed)
Pt endorses intermittent episodes of nausea, blurry vision and lightheadedness ongoing for the past few days but worse today. Pt denies pain, syncope.

## 2017-07-24 ENCOUNTER — Emergency Department (HOSPITAL_COMMUNITY)
Admission: EM | Admit: 2017-07-24 | Discharge: 2017-07-24 | Disposition: A | Discharge status: Home / Self Care | Payer: Self-pay | Attending: Emergency Medicine | Admitting: Emergency Medicine

## 2017-07-24 DIAGNOSIS — R42 Dizziness and giddiness: Secondary | ICD-10-CM

## 2017-07-24 NOTE — ED Notes (Signed)
No answer for room x3 

## 2018-01-02 ENCOUNTER — Emergency Department (HOSPITAL_COMMUNITY): Payer: Self-pay

## 2018-01-02 ENCOUNTER — Emergency Department (HOSPITAL_COMMUNITY)
Admission: EM | Admit: 2018-01-02 | Discharge: 2018-01-02 | Disposition: A | Discharge status: Home / Self Care | Payer: Self-pay | Attending: Emergency Medicine | Admitting: Emergency Medicine

## 2018-01-02 ENCOUNTER — Encounter (HOSPITAL_COMMUNITY): Payer: Self-pay | Admitting: *Deleted

## 2018-01-02 DIAGNOSIS — J45909 Unspecified asthma, uncomplicated: Secondary | ICD-10-CM | POA: Insufficient documentation

## 2018-01-02 DIAGNOSIS — E039 Hypothyroidism, unspecified: Secondary | ICD-10-CM | POA: Insufficient documentation

## 2018-01-02 DIAGNOSIS — N201 Calculus of ureter: Secondary | ICD-10-CM | POA: Insufficient documentation

## 2018-01-02 DIAGNOSIS — F17210 Nicotine dependence, cigarettes, uncomplicated: Secondary | ICD-10-CM | POA: Insufficient documentation

## 2018-01-02 LAB — COMPREHENSIVE METABOLIC PANEL
ALBUMIN: 4.2 g/dL (ref 3.5–5.0)
ALK PHOS: 76 U/L (ref 38–126)
ALT: 42 U/L (ref 0–44)
ANION GAP: 7 (ref 5–15)
AST: 33 U/L (ref 15–41)
BUN: 8 mg/dL (ref 6–20)
CALCIUM: 9.1 mg/dL (ref 8.9–10.3)
CO2: 25 mmol/L (ref 22–32)
CREATININE: 0.72 mg/dL (ref 0.44–1.00)
Chloride: 106 mmol/L (ref 98–111)
GFR calc Af Amer: >60 mL/min (ref >60)
GFR calc non Af Amer: >60 mL/min (ref >60)
GLUCOSE: 90 mg/dL (ref 70–99)
Potassium: 4 mmol/L (ref 3.5–5.1)
SODIUM: 138 mmol/L (ref 135–145)
Total Bilirubin: 0.2 mg/dL — ABNORMAL LOW (ref 0.3–1.2)
Total Protein: 8.2 g/dL — ABNORMAL HIGH (ref 6.5–8.1)

## 2018-01-02 LAB — URINALYSIS, ROUTINE W REFLEX MICROSCOPIC
Bilirubin Urine: NEGATIVE
GLUCOSE, UA: NEGATIVE mg/dL
Hgb urine dipstick: NEGATIVE
Ketones, ur: NEGATIVE mg/dL
NITRITE: NEGATIVE
PH: 6 (ref 5.0–8.0)
Protein, ur: NEGATIVE mg/dL
SPECIFIC GRAVITY, URINE: 1.019 (ref 1.005–1.030)

## 2018-01-02 LAB — CBC WITH DIFFERENTIAL/PLATELET
Basophils Absolute: 0 10*3/uL (ref 0.0–0.1)
Basophils Relative: 0 %
EOS ABS: 0.1 10*3/uL (ref 0.0–0.7)
Eosinophils Relative: 2 %
HCT: 31.6 % — ABNORMAL LOW (ref 36.0–46.0)
HEMOGLOBIN: 10.1 g/dL — AB (ref 12.0–15.0)
Lymphocytes Relative: 29 %
Lymphs Abs: 2.2 10*3/uL (ref 0.7–4.0)
MCH: 23.8 pg — AB (ref 26.0–34.0)
MCHC: 32 g/dL (ref 30.0–36.0)
MCV: 74.5 fL — AB (ref 78.0–100.0)
Monocytes Absolute: 0.3 10*3/uL (ref 0.1–1.0)
Monocytes Relative: 4 %
NEUTROS PCT: 65 %
Neutro Abs: 4.9 10*3/uL (ref 1.7–7.7)
Platelets: 415 10*3/uL — ABNORMAL HIGH (ref 150–400)
RBC: 4.24 MIL/uL (ref 3.87–5.11)
RDW: 19.9 % — ABNORMAL HIGH (ref 11.5–15.5)
WBC: 7.5 10*3/uL (ref 4.0–10.5)

## 2018-01-02 LAB — I-STAT BETA HCG BLOOD, ED (MC, WL, AP ONLY): I-stat hCG, quantitative: <5 m[IU]/mL (ref <5)

## 2018-01-02 LAB — LIPASE, BLOOD: Lipase: 45 U/L (ref 11–51)

## 2018-01-02 MED ORDER — NAPROXEN 375 MG PO TABS: 375.0000 mg | ORAL_TABLET | Freq: Two times a day (BID) | ORAL | 0 refills | Status: DC

## 2018-01-02 MED ORDER — HYDROCODONE-ACETAMINOPHEN 5-325 MG PO TABS: 1.0000 | ORAL_TABLET | Freq: Four times a day (QID) | ORAL | 0 refills | Status: DC | PRN

## 2018-01-02 MED ORDER — SODIUM CHLORIDE 0.9 % IV SOLN
INTRAVENOUS | Status: DC
  Administered 2018-01-02: 20:00:00 via INTRAVENOUS

## 2018-01-02 MED ORDER — SODIUM CHLORIDE 0.9 % IV BOLUS
1000.0000 mL | Freq: Once | INTRAVENOUS | Status: AC
  Administered 2018-01-02: 1000 mL via INTRAVENOUS

## 2018-01-02 MED ORDER — ONDANSETRON HCL 4 MG/2ML IJ SOLN
4.0000 mg | Freq: Once | INTRAMUSCULAR | Status: DC
  Filled 2018-01-02: qty 2

## 2018-01-02 NOTE — ED Notes (Signed)
Patient transported to CT 

## 2018-01-02 NOTE — ED Notes (Signed)
Patient ambulatory to restroom with assistance

## 2018-01-02 NOTE — Discharge Instructions (Signed)
Take the medications as needed for pain.  Follow-up with your primary care doctor or consider seeing urologist of the symptoms have not resolved over the next week.  Return to the ED for fevers chills worsening symptoms

## 2018-01-02 NOTE — ED Triage Notes (Signed)
Pt c/o right flank pain that started suddenly this morning radiating to lower abdomen. Pt sts pain comes and goes in waves

## 2018-01-02 NOTE — ED Provider Notes (Signed)
[facility]-EMERGENCY DEPT Provider Note   CSN: [id number] Arrival date & time: 01/02/18  1407     History   Chief Complaint Chief Complaint  Patient presents with  . Flank Pain  . Abdominal Pain    HPI Cheryl Gilbert is a 41 y.o. female.  HPI Pt had acute onset of pain today in her right flank radiating to her abdomen while she was at work today.  This occurred while she was at rest at work.  The pain has eased off a  Lot since then.  Earlier it was very intense and felt like contractions. She has noticed some frequency.  No urine discoloration.  No fever or vomiting. No vag discharge or bleeding.  SOme constipation but not severe. Past Medical History:  Diagnosis Date  . Anemia   . Asthma   . Eczema   . Hypothyroidism    "Not on meds since 1996"  . Migraine headache     There are no active problems to display for this patient.   Past Surgical History:  Procedure Laterality Date  . TUBAL LIGATION       OB History    Gravida  7   Para  5   Term  3   Preterm  2   AB  2   Living  5     SAB  2   TAB  0   Ectopic  0   Multiple  0   Live Births  5            Home Medications    Prior to Admission medications   Medication Sig Start Date End Date Taking? Authorizing Provider  HYDROcodone-acetaminophen (NORCO/VICODIN) 5-325 MG tablet Take 1 tablet by mouth every 6 (six) hours as needed for moderate pain. 1 or 2 tabs PO q6 hours prn pain 01/02/18   Linwood Dibbles, MD  methocarbamol (ROBAXIN) 500 MG tablet Take 2 tablets (1,000 mg total) by mouth 4 (four) times daily as needed for muscle spasms (muscle spasm/pain). Patient not taking: Reported on 01/02/2018 10/13/13   Samuel Jester, DO  naproxen (NAPROSYN) 375 MG tablet Take 1 tablet (375 mg total) by mouth 2 (two) times daily. 01/02/18   Linwood Dibbles, MD    Family History Family History  Problem Relation Age of Onset  . Cancer Mother   . Mental illness Mother   .  Multiple sclerosis Father   . Hypertension Maternal Grandmother   . Diabetes Maternal Grandfather   . Hypertension Maternal Grandfather     Social History Social History   Tobacco Use  . Smoking status: Current Every Day Smoker    Packs/day: 0.25    Years: 6.00    Pack years: 1.50    Types: Cigarettes  . Smokeless tobacco: Never Used  Substance Use Topics  . Alcohol use: No  . Drug use: No     Allergies   Patient has no known allergies.   Review of Systems Review of Systems  All other systems reviewed and are negative.    Physical Exam Updated Vital Signs BP 129/68 (BP Location: Right Arm)   Pulse 62   Temp 98.2 F (36.8 C) (Oral)   Resp 16   LMP 12/03/2017 (Approximate)   SpO2 100%   Physical Exam  Constitutional: She appears well-developed and well-nourished. No distress.  HENT:  Head: Normocephalic and atraumatic.  Right Ear: External ear normal.  Left Ear: External ear normal.  Eyes: Conjunctivae are normal. Right eye  exhibits no discharge. Left eye exhibits no discharge. No scleral icterus.  Neck: Neck supple. No tracheal deviation present.  Cardiovascular: Normal rate, regular rhythm and intact distal pulses.  Pulmonary/Chest: Effort normal and breath sounds normal. No stridor. No respiratory distress. She has no wheezes. She has no rales.  Abdominal: Soft. Bowel sounds are normal. She exhibits no distension. There is tenderness in the right lower quadrant. There is no rebound and no guarding.  Musculoskeletal: She exhibits no edema or tenderness.  Neurological: She is alert. She has normal strength. No cranial nerve deficit (no facial droop, extraocular movements intact, no slurred speech) or sensory deficit. She exhibits normal muscle tone. She displays no seizure activity. Coordination normal.  Skin: Skin is warm and dry. No rash noted.  Psychiatric: She has a normal mood and affect.  Nursing note and vitals reviewed.    ED Treatments / Results    Labs (all labs ordered are listed, but only abnormal results are displayed) Labs Reviewed  URINALYSIS, ROUTINE W REFLEX MICROSCOPIC - Abnormal; Notable for the following components:      Result Value   Leukocytes, UA TRACE (*)    Bacteria, UA FEW (*)    All other components within normal limits  COMPREHENSIVE METABOLIC PANEL - Abnormal; Notable for the following components:   Total Protein 8.2 (*)    Total Bilirubin 0.2 (*)    All other components within normal limits  CBC WITH DIFFERENTIAL/PLATELET - Abnormal; Notable for the following components:   Hemoglobin 10.1 (*)    HCT 31.6 (*)    MCV 74.5 (*)    MCH 23.8 (*)    RDW 19.9 (*)    Platelets 415 (*)    All other components within normal limits  LIPASE, BLOOD  CBC WITH DIFFERENTIAL/PLATELET  I-STAT BETA HCG BLOOD, ED ([facility], [facility], [facility] ONLY)    EKG None  Radiology Ct Renal Stone Study  Result Date: 01/02/2018 CLINICAL DATA:  42 y/o  F; right-sided flank pain. EXAM: CT ABDOMEN AND PELVIS WITHOUT CONTRAST TECHNIQUE: Multidetector CT imaging of the abdomen and pelvis was performed following the standard protocol without IV contrast. COMPARISON:  None. FINDINGS: Lower chest: 3 mm left lower lobe perifissural nodule, likely intrapulmonary lymph node. Hepatobiliary: No focal liver abnormality is seen. No gallstones, gallbladder wall thickening, or biliary dilatation. Pancreas: Unremarkable. No pancreatic ductal dilatation or surrounding inflammatory changes. Spleen: Normal in size without focal abnormality. Adrenals/Urinary Tract: Adrenal glands are unremarkable. Kidneys are normal, without renal calculi, focal lesion, or hydronephrosis. 3 mm calcification in the right hemipelvis along the course of the right ureter, possibly an non obstructing ureter stone (series 5, image 84 and series 2, image 59). Normal bladder. Stomach/Bowel: Stomach is within normal limits. Appendix appears normal. No evidence of bowel wall thickening, distention, or  inflammatory changes. Vascular/Lymphatic: No significant vascular findings are present. No enlarged abdominal or pelvic lymph nodes. Reproductive: Uterus and bilateral adnexa are unremarkable. Other: No abdominal wall hernia or abnormality. No abdominopelvic ascites. Musculoskeletal: No acute or significant osseous findings. IMPRESSION: 3 mm calcification in the right hemipelvis along the course of the right ureter, possibly an non obstructing ureter stone. No hydronephrosis. Electronically Signed   By: Mitzi HansenLance  Furusawa-Stratton M.D.   On: 01/02/2018 20:18    Procedures Procedures (including critical care time)  Medications Ordered in ED Medications  sodium chloride 0.9 % bolus 1,000 mL (0 mLs Intravenous Stopped 01/02/18 1926)    And  0.9 %  sodium chloride infusion ( Intravenous  New Bag/Given 01/02/18 1937)  ondansetron (ZOFRAN) injection 4 mg (4 mg Intravenous Refused 01/02/18 1810)     Initial Impression / Assessment and Plan / ED Course  I have reviewed the triage vital signs and the nursing notes.  Pertinent labs & imaging results that were available during my care of the patient were reviewed by me and considered in my medical decision making (see chart for details).   Patient presented to the emergency room with complaints of acute right flank pain that radiated to the right abdomen.  Patient symptoms were suggestive of renal colic.  Laboratory tests are reassuring.  CT scan suggests a right ureteral stone although there is no hydronephrosis.  Patient has remained asymptomatic in the ED.  Plan on discharge home with pain medications.  Discussed outpatient follow-up and warning signs and precautions to return  Final Clinical Impressions(s) / ED Diagnoses   Final diagnoses:  Right ureteral stone    ED Discharge Orders        Ordered    HYDROcodone-acetaminophen (NORCO/VICODIN) 5-325 MG tablet  Every 6 hours PRN     01/02/18 2044    naproxen (NAPROSYN) 375 MG tablet  2 times daily       01/02/18 2044       Linwood Dibbles, MD 01/02/18 2046

## 2018-08-07 ENCOUNTER — Emergency Department (HOSPITAL_COMMUNITY)
Admission: EM | Admit: 2018-08-07 | Discharge: 2018-08-08 | Disposition: A | Discharge status: Other Acute Inpatient Hospital | Payer: BLUE CROSS/BLUE SHIELD | Source: Home / Self Care | Attending: Emergency Medicine | Admitting: Emergency Medicine

## 2018-08-07 ENCOUNTER — Encounter (HOSPITAL_COMMUNITY): Payer: Self-pay | Admitting: Emergency Medicine

## 2018-08-07 DIAGNOSIS — E039 Hypothyroidism, unspecified: Secondary | ICD-10-CM

## 2018-08-07 DIAGNOSIS — Z818 Family history of other mental and behavioral disorders: Secondary | ICD-10-CM

## 2018-08-07 DIAGNOSIS — F329 Major depressive disorder, single episode, unspecified: Secondary | ICD-10-CM | POA: Insufficient documentation

## 2018-08-07 DIAGNOSIS — K219 Gastro-esophageal reflux disease without esophagitis: Secondary | ICD-10-CM | POA: Diagnosis present

## 2018-08-07 DIAGNOSIS — Z599 Problem related to housing and economic circumstances, unspecified: Secondary | ICD-10-CM | POA: Insufficient documentation

## 2018-08-07 DIAGNOSIS — R41843 Psychomotor deficit: Secondary | ICD-10-CM | POA: Diagnosis present

## 2018-08-07 DIAGNOSIS — G4700 Insomnia, unspecified: Secondary | ICD-10-CM | POA: Diagnosis present

## 2018-08-07 DIAGNOSIS — J45909 Unspecified asthma, uncomplicated: Secondary | ICD-10-CM

## 2018-08-07 DIAGNOSIS — F419 Anxiety disorder, unspecified: Secondary | ICD-10-CM | POA: Diagnosis present

## 2018-08-07 DIAGNOSIS — F322 Major depressive disorder, single episode, severe without psychotic features: Principal | ICD-10-CM | POA: Diagnosis present

## 2018-08-07 DIAGNOSIS — F17210 Nicotine dependence, cigarettes, uncomplicated: Secondary | ICD-10-CM

## 2018-08-07 DIAGNOSIS — R45851 Suicidal ideations: Secondary | ICD-10-CM | POA: Insufficient documentation

## 2018-08-07 LAB — COMPREHENSIVE METABOLIC PANEL
ALBUMIN: 4.4 g/dL (ref 3.5–5.0)
ALT: 27 U/L (ref 0–44)
AST: 27 U/L (ref 15–41)
Alkaline Phosphatase: 67 U/L (ref 38–126)
Anion gap: 10 (ref 5–15)
BUN: 9 mg/dL (ref 6–20)
CHLORIDE: 104 mmol/L (ref 98–111)
CO2: 25 mmol/L (ref 22–32)
Calcium: 9.3 mg/dL (ref 8.9–10.3)
Creatinine, Ser: 0.8 mg/dL (ref 0.44–1.00)
GFR calc Af Amer: >60 mL/min (ref >60)
GFR calc non Af Amer: >60 mL/min (ref >60)
GLUCOSE: 97 mg/dL (ref 70–99)
Potassium: 3.1 mmol/L — ABNORMAL LOW (ref 3.5–5.1)
Sodium: 139 mmol/L (ref 135–145)
Total Bilirubin: 0.5 mg/dL (ref 0.3–1.2)
Total Protein: 8 g/dL (ref 6.5–8.1)

## 2018-08-07 LAB — CBC
HCT: 33.3 % — ABNORMAL LOW (ref 36.0–46.0)
HEMOGLOBIN: 10.2 g/dL — AB (ref 12.0–15.0)
MCH: 24.2 pg — ABNORMAL LOW (ref 26.0–34.0)
MCHC: 30.6 g/dL (ref 30.0–36.0)
MCV: 79.1 fL — ABNORMAL LOW (ref 80.0–100.0)
Platelets: 479 10*3/uL — ABNORMAL HIGH (ref 150–400)
RBC: 4.21 MIL/uL (ref 3.87–5.11)
RDW: 16.8 % — ABNORMAL HIGH (ref 11.5–15.5)
WBC: 9.6 10*3/uL (ref 4.0–10.5)
nRBC: 0 % (ref 0.0–0.2)

## 2018-08-07 LAB — I-STAT BETA HCG BLOOD, ED (MC, WL, AP ONLY): I-stat hCG, quantitative: <5 m[IU]/mL (ref <5)

## 2018-08-07 LAB — SALICYLATE LEVEL: Salicylate Lvl: <7 mg/dL (ref 2.8–30.0)

## 2018-08-07 LAB — ACETAMINOPHEN LEVEL: Acetaminophen (Tylenol), Serum: <10 ug/mL — ABNORMAL LOW (ref 10–30)

## 2018-08-07 LAB — ETHANOL: Alcohol, Ethyl (B): <10 mg/dL (ref <10)

## 2018-08-07 MED ORDER — ACETAMINOPHEN 500 MG PO TABS: 500.0000 mg | ORAL_TABLET | Freq: Four times a day (QID) | ORAL | Status: DC | PRN

## 2018-08-07 MED ORDER — LORAZEPAM 1 MG PO TABS: 1.0000 mg | ORAL_TABLET | ORAL | Status: DC | PRN

## 2018-08-07 NOTE — ED Provider Notes (Signed)
[facility]-EMERGENCY DEPT Provider Note   CSN: [id number] Arrival date & time: 08/07/18  2039     History   Chief Complaint Chief Complaint  Patient presents with  . Suicidal    HPI Cheryl Gilbert is a 43 y.o. female history of anemia, asthma here presenting with suicidal ideation.  Patient states that she has been very overwhelmed recently.  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  She is trying to support her 5 kids.  She states that she is getting evicted by her landlord and had unpaid bills.  She just felt overwhelmed and wrote a note to her children that she is better off not living anymore.  They recommended that she come to the ER for evaluation.  Patient denies any hallucinations or any particular plan to kill herself.  No history of psychiatric disorder.  The history is provided by the patient.    Past Medical History:  Diagnosis Date  . Anemia   . Asthma   . Eczema   . Hypothyroidism    "Not on meds since 1996"  . Migraine headache     There are no active problems to display for this patient.   Past Surgical History:  Procedure Laterality Date  . TUBAL LIGATION       OB History    Gravida  7   Para  5   Term  3   Preterm  2   AB  2   Living  5     SAB  2   TAB  0   Ectopic  0   Multiple  0   Live Births  5            Home Medications    Prior to Admission medications   Medication Sig Start Date End Date Taking? Authorizing Provider  HYDROcodone-acetaminophen (NORCO/VICODIN) 5-325 MG tablet Take 1 tablet by mouth every 6 (six) hours as needed for moderate pain. 1 or 2 tabs PO q6 hours prn pain Patient not taking: Reported on 08/07/2018 01/02/18   Linwood DibblesKnapp, Jon, MD  methocarbamol (ROBAXIN) 500 MG tablet Take 2 tablets (1,000 mg total) by mouth 4 (four) times daily as needed for muscle spasms (muscle spasm/pain). Patient not taking: Reported on 08/07/2018 10/13/13   Samuel JesterMcManus, Kathleen, DO  naproxen  (NAPROSYN) 375 MG tablet Take 1 tablet (375 mg total) by mouth 2 (two) times daily. Patient not taking: Reported on 08/07/2018 01/02/18   Linwood DibblesKnapp, Jon, MD    Family History Family History  Problem Relation Age of Onset  . Cancer Mother   . Mental illness Mother   . Multiple sclerosis Father   . Hypertension Maternal Grandmother   . Diabetes Maternal Grandfather   . Hypertension Maternal Grandfather     Social History Social History   Tobacco Use  . Smoking status: Current Every Day Smoker    Packs/day: 0.25    Years: 6.00    Pack years: 1.50    Types: Cigarettes  . Smokeless tobacco: Never Used  Substance Use Topics  . Alcohol use: No  . Drug use: No     Allergies   Patient has no known allergies.   Review of Systems Review of Systems  Psychiatric/Behavioral: Positive for dysphoric mood.  All other systems reviewed and are negative.    Physical Exam Updated Vital Signs BP 139/82 (BP Location: Left Arm)   Pulse 78   Temp (!) 97.4 F (36.3 C) (Oral)   Resp 18  Ht 5\' 5"  (1.651 m)   Wt 86.2 kg   SpO2 100%   BMI 31.62 kg/m   Physical Exam Vitals signs and nursing note reviewed.  Constitutional:      Comments: Depressed   HENT:     Head: Normocephalic.     Mouth/Throat:     Mouth: Mucous membranes are moist.  Eyes:     Extraocular Movements: Extraocular movements intact.     Pupils: Pupils are equal, round, and reactive to light.  Neck:     Musculoskeletal: Normal range of motion.  Cardiovascular:     Rate and Rhythm: Normal rate.     Pulses: Normal pulses.     Heart sounds: Normal heart sounds.  Pulmonary:     Effort: Pulmonary effort is normal.     Breath sounds: Normal breath sounds.  Abdominal:     General: Abdomen is flat.     Palpations: Abdomen is soft.  Musculoskeletal: Normal range of motion.  Skin:    General: Skin is warm.     Capillary Refill: Capillary refill takes less than 2 seconds.  Neurological:     General: No focal deficit  present.     Mental Status: She is alert and oriented to person, place, and time.  Psychiatric:     Comments: Depressed, tearful       ED Treatments / Results  Labs (all labs ordered are listed, but only abnormal results are displayed) Labs Reviewed  COMPREHENSIVE METABOLIC PANEL - Abnormal; Notable for the following components:      Result Value   Potassium 3.1 (*)    All other components within normal limits  ACETAMINOPHEN LEVEL - Abnormal; Notable for the following components:   Acetaminophen (Tylenol), Serum <10 (*)    All other components within normal limits  CBC - Abnormal; Notable for the following components:   Hemoglobin 10.2 (*)    HCT 33.3 (*)    MCV 79.1 (*)    MCH 24.2 (*)    RDW 16.8 (*)    Platelets 479 (*)    All other components within normal limits  ETHANOL  SALICYLATE LEVEL  RAPID URINE DRUG SCREEN, HOSP PERFORMED  I-STAT BETA HCG BLOOD, ED ([facility], [facility], [facility] ONLY)    EKG None  Radiology No results found.  Procedures Procedures (including critical care time)  Medications Ordered in ED Medications - No data to display   Initial Impression / Assessment and Plan / ED Course  I have reviewed the triage vital signs and the nursing notes.  Pertinent labs & imaging results that were available during my care of the patient were reviewed by me and considered in my medical decision making (see chart for details).    Cheryl Gilbert is a 43 y.o. female here with suicidal ideations. No specific plan and didn't overdose on meds. Labs unremarkable. Medically cleared for psych eval.    Final Clinical Impressions(s) / ED Diagnoses   Final diagnoses:  None    ED Discharge Orders    None       Charlynne Pander, MD 08/07/18 2300

## 2018-08-07 NOTE — Progress Notes (Addendum)
Received Delman Kitten this PM from the main ED,  alert and cooperative. Her words were few and she endorsed not knowing exactly how she feels. She is unsure if she feels suicidal. She was given water per her request. Pharmacy and registration talked with her then she went to bed.  She slept throughout the night. Her suicide not was placed on the chart.

## 2018-08-07 NOTE — ED Triage Notes (Addendum)
Patient here from home with complaints of suicidal ideation today due to "life stressors" with plan. Wrote suicide letter today to children. Denies previous psychiatric history.

## 2018-08-08 ENCOUNTER — Encounter (HOSPITAL_COMMUNITY): Payer: Self-pay

## 2018-08-08 ENCOUNTER — Other Ambulatory Visit: Payer: Self-pay | Admitting: Registered Nurse

## 2018-08-08 ENCOUNTER — Inpatient Hospital Stay (HOSPITAL_COMMUNITY)
Admission: AD | Admit: 2018-08-08 | Discharge: 2018-08-09 | DRG: 885 | Disposition: A | Discharge status: Home / Self Care | Payer: BLUE CROSS/BLUE SHIELD | Source: Intra-hospital | Attending: Psychiatry | Admitting: Psychiatry

## 2018-08-08 ENCOUNTER — Other Ambulatory Visit: Payer: Self-pay

## 2018-08-08 DIAGNOSIS — J45909 Unspecified asthma, uncomplicated: Secondary | ICD-10-CM | POA: Diagnosis present

## 2018-08-08 DIAGNOSIS — F322 Major depressive disorder, single episode, severe without psychotic features: Secondary | ICD-10-CM | POA: Diagnosis present

## 2018-08-08 DIAGNOSIS — Z818 Family history of other mental and behavioral disorders: Secondary | ICD-10-CM | POA: Diagnosis not present

## 2018-08-08 DIAGNOSIS — R45851 Suicidal ideations: Secondary | ICD-10-CM | POA: Diagnosis present

## 2018-08-08 DIAGNOSIS — E039 Hypothyroidism, unspecified: Secondary | ICD-10-CM | POA: Diagnosis present

## 2018-08-08 DIAGNOSIS — Z599 Problem related to housing and economic circumstances, unspecified: Secondary | ICD-10-CM | POA: Diagnosis not present

## 2018-08-08 DIAGNOSIS — Z598 Other problems related to housing and economic circumstances: Secondary | ICD-10-CM | POA: Diagnosis not present

## 2018-08-08 DIAGNOSIS — G4700 Insomnia, unspecified: Secondary | ICD-10-CM | POA: Diagnosis present

## 2018-08-08 DIAGNOSIS — F419 Anxiety disorder, unspecified: Secondary | ICD-10-CM | POA: Diagnosis present

## 2018-08-08 DIAGNOSIS — R41843 Psychomotor deficit: Secondary | ICD-10-CM | POA: Diagnosis present

## 2018-08-08 DIAGNOSIS — K219 Gastro-esophageal reflux disease without esophagitis: Secondary | ICD-10-CM | POA: Diagnosis present

## 2018-08-08 DIAGNOSIS — F17210 Nicotine dependence, cigarettes, uncomplicated: Secondary | ICD-10-CM | POA: Diagnosis present

## 2018-08-08 HISTORY — DX: Type 2 diabetes mellitus with hypoglycemia without coma: E11.649

## 2018-08-08 LAB — RETICULOCYTES
Immature Retic Fract: 13.7 % (ref 2.3–15.9)
RBC.: 4.14 MIL/uL (ref 3.87–5.11)
Retic Count, Absolute: 35.2 10*3/uL (ref 19.0–186.0)
Retic Ct Pct: 0.9 % (ref 0.4–3.1)

## 2018-08-08 LAB — RAPID URINE DRUG SCREEN, HOSP PERFORMED
AMPHETAMINES: NOT DETECTED
Barbiturates: NOT DETECTED
Benzodiazepines: NOT DETECTED
Cocaine: NOT DETECTED
Opiates: NOT DETECTED
Tetrahydrocannabinol: NOT DETECTED

## 2018-08-08 LAB — FERRITIN: Ferritin: 7 ng/mL — ABNORMAL LOW (ref 11–307)

## 2018-08-08 LAB — IRON AND TIBC
Iron: 40 ug/dL (ref 28–170)
Saturation Ratios: 7 % — ABNORMAL LOW (ref 10.4–31.8)
TIBC: 541 ug/dL — ABNORMAL HIGH (ref 250–450)
UIBC: 501 ug/dL

## 2018-08-08 LAB — FOLATE: Folate: 18.6 ng/mL (ref >5.9)

## 2018-08-08 LAB — VITAMIN B12: VITAMIN B 12: 387 pg/mL (ref 180–914)

## 2018-08-08 MED ORDER — NICOTINE 7 MG/24HR TD PT24
7.0000 mg | MEDICATED_PATCH | Freq: Every day | TRANSDERMAL | Status: DC
  Administered 2018-08-08 – 2018-08-09 (×2): 7 mg via TRANSDERMAL
  Filled 2018-08-08 (×3): qty 1

## 2018-08-08 MED ORDER — POTASSIUM CHLORIDE CRYS ER 20 MEQ PO TBCR
20.0000 meq | EXTENDED_RELEASE_TABLET | Freq: Two times a day (BID) | ORAL | Status: DC
  Administered 2018-08-08 – 2018-08-09 (×2): 20 meq via ORAL
  Filled 2018-08-08 (×6): qty 1

## 2018-08-08 MED ORDER — VITAMIN B-1 100 MG PO TABS
100.0000 mg | ORAL_TABLET | Freq: Every day | ORAL | Status: DC
  Administered 2018-08-08 – 2018-08-09 (×2): 100 mg via ORAL
  Filled 2018-08-08 (×3): qty 1

## 2018-08-08 MED ORDER — MAGNESIUM HYDROXIDE 400 MG/5ML PO SUSP: 30.0000 mL | Freq: Every day | ORAL | Status: DC | PRN

## 2018-08-08 MED ORDER — ACETAMINOPHEN 325 MG PO TABS: 650.0000 mg | ORAL_TABLET | Freq: Four times a day (QID) | ORAL | Status: DC | PRN

## 2018-08-08 MED ORDER — FAMOTIDINE 20 MG PO TABS
20.0000 mg | ORAL_TABLET | Freq: Every day | ORAL | Status: DC
  Administered 2018-08-08 – 2018-08-09 (×2): 20 mg via ORAL
  Filled 2018-08-08 (×3): qty 1

## 2018-08-08 MED ORDER — FOLIC ACID 1 MG PO TABS
1.0000 mg | ORAL_TABLET | Freq: Every day | ORAL | Status: DC
  Administered 2018-08-08 – 2018-08-09 (×2): 1 mg via ORAL
  Filled 2018-08-08 (×3): qty 1

## 2018-08-08 MED ORDER — ALUM & MAG HYDROXIDE-SIMETH 200-200-20 MG/5ML PO SUSP: 30.0000 mL | ORAL | Status: DC | PRN

## 2018-08-08 MED ORDER — SERTRALINE HCL 25 MG PO TABS
25.0000 mg | ORAL_TABLET | Freq: Every day | ORAL | Status: DC
  Administered 2018-08-08 – 2018-08-09 (×2): 25 mg via ORAL
  Filled 2018-08-08 (×3): qty 1

## 2018-08-08 MED ORDER — HYDROXYZINE HCL 25 MG PO TABS: 25.0000 mg | ORAL_TABLET | Freq: Three times a day (TID) | ORAL | Status: DC | PRN

## 2018-08-08 MED ORDER — TRAZODONE HCL 50 MG PO TABS
50.0000 mg | ORAL_TABLET | Freq: Every evening | ORAL | Status: DC | PRN
  Filled 2018-08-08 (×4): qty 1

## 2018-08-08 NOTE — H&P (Signed)
Psychiatric Admission Assessment Adult  Patient Identification: Cheryl Gilbert MRN:  [id number] Date of Evaluation:  08/08/2018 Chief Complaint:  MDD Principal Diagnosis: <principal problem not specified> Diagnosis:  Active Problems:   MDD (major depressive disorder), severe (HCC)  History of Present Illness: Patient is seen and examined.  Patient is a 43 year old female with a remote past psychiatric history significant for depression who presented to the [facility] emergency department on 08/07/2018 with suicidal ideation.  The patient was brought to the emergency department by [facility] police, a friend and her 33 year old daughter.  Apparently the patient had been having significant financial problems, and was falling further behind.  Her power was turned off at one time, and yesterday she got an eviction notice.  She stated she felt overwhelmed by that and sent out to "I am better off not living anymore", "goodbye".  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  She lives with her 38 year old child, and her 19 year old child lives with the grandparent.  She originally received the eviction notice approximately 2 weeks ago, but apparently they refused that payment.  She felt overwhelmed and exhausted because of her work schedule as well as falling further behind on her bills.  She denied any excessive spending or manic symptoms.  She denied any substance abuse.  She stated that she had had an episode of postpartum depression over 20 years ago with 1 of her children.  She was hospitalized for a short period of time, but was discharged on no medications.  She reported no family history of suicide in her family.  She admitted to being frustrated, feeling overwhelmed, feeling helpless and hopeless with regard to her current life situation.  She was admitted  to the hospital for evaluation and stabilization.  Associated Signs/Symptoms: Depression Symptoms:  depressed mood, anhedonia, insomnia, psychomotor retardation, fatigue, feelings of worthlessness/guilt, difficulty concentrating, hopelessness, suicidal thoughts without plan, anxiety, loss of energy/fatigue, disturbed sleep, (Hypo) Manic Symptoms:  Impulsivity, Anxiety Symptoms:  Excessive Worry, Psychotic Symptoms:  Denied PTSD Symptoms: Negative Total Time spent with patient: 45 minutes  Past Psychiatric History: Patient admitted to one previous psychiatric hospitalization 20 years ago for postpartum depression.  She stated that she had not received any medication at that time.  She denied any family history of suicide or suicide attempts.  Is the patient at risk to self? Yes.    Has the patient been a risk to self in the past 6 months? No.  Has the patient been a risk to self within the distant past? No.  Is the patient a risk to others? No.  Has the patient been a risk to others in the past 6 months? No.  Has the patient been a risk to others within the distant past? No.   Prior Inpatient Therapy:   Prior Outpatient Therapy:    Alcohol Screening: Alcohol Brief Interventions/Follow-up: AUDIT Score <7 follow-up not indicated Substance Abuse History in the last 12 months:  No. Consequences of Substance Abuse: Negative Previous Psychotropic Medications: No  Psychological Evaluations: No  Past Medical History:  Past Medical History:  Diagnosis Date  . Anemia   . Asthma   . Eczema   . Hypoglycemia associated with diabetes (HCC)   . Hypothyroidism    "Not on meds since 1996"  . Migraine headache     Past Surgical History:  Procedure Laterality Date  .  TUBAL LIGATION     Family History:  Family History  Problem Relation Age of Onset  . Cancer Mother   . Mental illness Mother   . Multiple sclerosis Father   . Hypertension Maternal Grandmother   . Diabetes  Maternal Grandfather   . Hypertension Maternal Grandfather    Family Psychiatric  History: Denied Tobacco Screening: Have you used any form of tobacco in the last 30 days? (Cigarettes, Smokeless Tobacco, Cigars, and/or Pipes): Yes Tobacco use, Select all that apply: 5 or more cigarettes per day Are you interested in Tobacco Cessation Medications?: Yes, will notify MD for an order Counseled patient on smoking cessation including recognizing danger situations, developing coping skills and basic information about quitting provided: Yes Social History:  Social History   Substance and Sexual Activity  Alcohol Use Yes   Comment: occasional alcohol use twice a year     Social History   Substance and Sexual Activity  Drug Use No    Additional Social History:                           Allergies:  No Known Allergies Lab Results:  Results for orders placed or performed during the hospital encounter of 08/07/18 (from the past 48 hour(s))  Comprehensive metabolic panel     Status: Abnormal   Collection Time: 08/07/18  9:34 PM  Result Value Ref Range   Sodium 139 135 - 145 mmol/L   Potassium 3.1 (L) 3.5 - 5.1 mmol/L   Chloride 104 98 - 111 mmol/L   CO2 25 22 - 32 mmol/L   Glucose, Bld 97 70 - 99 mg/dL   BUN 9 6 - 20 mg/dL   Creatinine, Ser 1.610.80 0.44 - 1.00 mg/dL   Calcium 9.3 8.9 - 09.610.3 mg/dL   Total Protein 8.0 6.5 - 8.1 g/dL   Albumin 4.4 3.5 - 5.0 g/dL   AST 27 15 - 41 U/L   ALT 27 0 - 44 U/L   Alkaline Phosphatase 67 38 - 126 U/L   Total Bilirubin 0.5 0.3 - 1.2 mg/dL   GFR calc non Af Amer >60 >60 mL/min   GFR calc Af Amer >60 >60 mL/min   Anion gap 10 5 - 15    Comment: Performed at [facility], [address] [place], [place] [address]  Ethanol     Status: None   Collection Time: 08/07/18  9:34 PM  Result Value Ref Range   Alcohol, Ethyl (B) <10 <10 mg/dL    Comment: (NOTE) Lowest detectable limit for serum alcohol is 10 mg/dL. For medical  purposes only. Performed at [facility], [address] [place], [place] [address]   Salicylate level     Status: None   Collection Time: 08/07/18  9:34 PM  Result Value Ref Range   Salicylate Lvl <7.0 2.8 - 30.0 mg/dL    Comment: Performed at [facility], [address] [place], [place] [address]  Acetaminophen level     Status: Abnormal   Collection Time: 08/07/18  9:34 PM  Result Value Ref Range   Acetaminophen (Tylenol), Serum <10 (L) 10 - 30 ug/mL    Comment: (NOTE) Therapeutic concentrations vary significantly. A range of 10-30 ug/mL  may be an effective concentration for many patients. However, some  are best treated at concentrations outside of this range. Acetaminophen concentrations >150 ug/mL at 4 hours after ingestion  and >50 ug/mL at  12 hours after ingestion are often associated with  toxic reactions. Performed at [facility], [address], [place], [place] [address]   cbc     Status: Abnormal   Collection Time: 08/07/18  9:34 PM  Result Value Ref Range   WBC 9.6 4.0 - 10.5 K/uL   RBC 4.21 3.87 - 5.11 MIL/uL   Hemoglobin 10.2 (L) 12.0 - 15.0 g/dL   HCT 25.4 (L) 98.2 - 64.1 %   MCV 79.1 (L) 80.0 - 100.0 fL   MCH 24.2 (L) 26.0 - 34.0 pg   MCHC 30.6 30.0 - 36.0 g/dL   RDW 58.3 (H) 09.4 - 07.6 %   Platelets 479 (H) 150 - 400 K/uL   nRBC 0.0 0.0 - 0.2 %    Comment: Performed at [facility], [address] [place], [place] [address]  I-Stat beta hCG blood, ED     Status: None   Collection Time: 08/07/18  9:38 PM  Result Value Ref Range   I-stat hCG, quantitative <5.0 <5 mIU/mL   Comment 3            Comment:   GEST. AGE      CONC.  (mIU/mL)   <=1 WEEK        5 - 50     2 WEEKS       50 - 500     3 WEEKS       100 - 10,000     4 WEEKS     1,000 - 30,000        FEMALE AND NON-PREGNANT FEMALE:     LESS THAN 5 mIU/mL   Rapid urine drug screen (hospital performed)     Status: None    Collection Time: 08/08/18 11:19 AM  Result Value Ref Range   Opiates NONE DETECTED NONE DETECTED   Cocaine NONE DETECTED NONE DETECTED   Benzodiazepines NONE DETECTED NONE DETECTED   Amphetamines NONE DETECTED NONE DETECTED   Tetrahydrocannabinol NONE DETECTED NONE DETECTED   Barbiturates NONE DETECTED NONE DETECTED    Comment: (NOTE) DRUG SCREEN FOR MEDICAL PURPOSES ONLY.  IF CONFIRMATION IS NEEDED FOR ANY PURPOSE, NOTIFY LAB WITHIN 5 DAYS. LOWEST DETECTABLE LIMITS FOR URINE DRUG SCREEN Drug Class                     Cutoff (ng/mL) Amphetamine and metabolites    1000 Barbiturate and metabolites    200 Benzodiazepine                 200 Tricyclics and metabolites     300 Opiates and metabolites        300 Cocaine and metabolites        300 THC                            50 Performed at [facility], [address], [place], [place] [address]     Blood Alcohol level:  Lab Results  Component Value Date   ETH <10 08/07/2018    Metabolic Disorder Labs:  No results found for: HGBA1C, MPG No results found for: PROLACTIN No results found for: CHOL, TRIG, HDL, CHOLHDL, VLDL, LDLCALC  Current Medications: Current Facility-Administered Medications  Medication Dose Route Frequency Provider Last Rate Last Dose  . acetaminophen (TYLENOL) tablet 650 mg  650 mg Oral Q6H PRN Kerry Hough, PA-C      .  alum & mag hydroxide-simeth (MAALOX/MYLANTA) 200-200-20 MG/5ML suspension 30 mL  30 mL Oral Q4H PRN Donell Sievert E, PA-C      . hydrOXYzine (ATARAX/VISTARIL) tablet 25 mg  25 mg Oral TID PRN Kerry Hough, PA-C      . magnesium hydroxide (MILK OF MAGNESIA) suspension 30 mL  30 mL Oral Daily PRN Donell Sievert E, PA-C      . nicotine (NICODERM CQ - dosed in mg/24 hr) patch 7 mg  7 mg Transdermal Daily Antonieta Pert, MD      . traZODone (DESYREL) tablet 50 mg  50 mg Oral QHS,MR X 1 Simon, Spencer E, PA-C       PTA Medications: Medications Prior to  Admission  Medication Sig Dispense Refill Last Dose  . HYDROcodone-acetaminophen (NORCO/VICODIN) 5-325 MG tablet Take 1 tablet by mouth every 6 (six) hours as needed for moderate pain. 1 or 2 tabs PO q6 hours prn pain (Patient not taking: Reported on 08/07/2018) 12 tablet 0 Not Taking at Unknown time  . methocarbamol (ROBAXIN) 500 MG tablet Take 2 tablets (1,000 mg total) by mouth 4 (four) times daily as needed for muscle spasms (muscle spasm/pain). (Patient not taking: Reported on 08/07/2018) 25 tablet 0 Not Taking at Unknown time  . naproxen (NAPROSYN) 375 MG tablet Take 1 tablet (375 mg total) by mouth 2 (two) times daily. (Patient not taking: Reported on 08/07/2018) 20 tablet 0 Not Taking at Unknown time    Musculoskeletal: Strength & Muscle Tone: within normal limits Gait & Station: normal Patient leans: N/A  Psychiatric Specialty Exam: Physical Exam  Nursing note and vitals reviewed. Constitutional: She is oriented to person, place, and time. She appears well-developed and well-nourished.  HENT:  Head: Normocephalic and atraumatic.  Respiratory: Effort normal.  Neurological: She is alert and oriented to person, place, and time.    ROS  Blood pressure 112/71, pulse 69, temperature 99.1 F (37.3 C), temperature source Oral, resp. rate 16, height 5\' 5"  (1.651 m), weight 86.2 kg, last menstrual period 07/05/2018, SpO2 100 %.Body mass index is 31.62 kg/m.  General Appearance: Casual  Eye Contact:  Fair  Speech:  Normal Rate  Volume:  Decreased  Mood:  Depressed  Affect:  Congruent  Thought Process:  Coherent and Descriptions of Associations: Intact  Orientation:  Full (Time, Place, and Person)  Thought Content:  Logical  Suicidal Thoughts:  No  Homicidal Thoughts:  No  Memory:  Immediate;   Fair Recent;   Fair Remote;   Fair  Judgement:  Intact  Insight:  Fair  Psychomotor Activity:  Decreased  Concentration:  Concentration: Fair and Attention Span: Fair  Recall:  [employer]  of Knowledge:  Fair  Language:  Good  Akathisia:  Negative  Handed:  Right  AIMS (if indicated):     Assets:  Communication Skills Desire for Improvement Resilience Social Support  ADL's:  Intact  Cognition:  WNL  Sleep:       Treatment Plan Summary: Daily contact with patient to assess and evaluate symptoms and progress in treatment, Medication management and Plan Patient is seen and examined.  Patient is a 43 year old female with the above-stated past psychiatric history with an episode of major depression.  She also qualifies most likely for generalized anxiety disorder.  She will be admitted to the hospital.  Should be integrated into the milieu.  She will be encouraged to attend coping skills training.  I have already spoken with social work to talk with  the patient about community resources to help her with housing as well as perhaps with her electric bill.  We have discussed medication, and she is willing to give a trial of Zoloft.  We will start at 25 mg p.o. daily and titrate that during the course the hospitalization.  She will also have available hydroxyzine and trazodone for anxiety and insomnia respectively.  Review of her laboratories shows that she has a mild anemia, but decreased MCV and other parameters.  Given the fact that she has limited resources for medical work-up I will run an anemia panel on her to see if she is iron, B12 or folate deficient.  I will go on and start her on folic acid as well as thiamine tonight.  Hopefully we can put her in touch with some community resources to decrease the stress in her life.  Observation Level/Precautions:  15 minute checks  Laboratory:  Chemistry Profile Folic Acid Vitamin B-12  Psychotherapy:    Medications:    Consultations:    Discharge Concerns:    Estimated LOS:  Other:     Physician Treatment Plan for Primary Diagnosis: <principal problem not specified> Long Term Goal(s): Improvement in symptoms so as ready for  discharge  Short Term Goals: Ability to identify changes in lifestyle to reduce recurrence of condition will improve, Ability to verbalize feelings will improve, Ability to disclose and discuss suicidal ideas, Ability to demonstrate self-control will improve, Ability to identify and develop effective coping behaviors will improve and Ability to maintain clinical measurements within normal limits will improve  Physician Treatment Plan for Secondary Diagnosis: Active Problems:   MDD (major depressive disorder), severe (HCC)  Long Term Goal(s): Improvement in symptoms so as ready for discharge  Short Term Goals: Ability to identify changes in lifestyle to reduce recurrence of condition will improve, Ability to verbalize feelings will improve, Ability to disclose and discuss suicidal ideas, Ability to demonstrate self-control will improve, Ability to identify and develop effective coping behaviors will improve and Ability to maintain clinical measurements within normal limits will improve  I certify that inpatient services furnished can reasonably be expected to improve the patient's condition.    Antonieta PertGreg Lawson Clary, MD 2/13/20202:59 PM

## 2018-08-08 NOTE — ED Notes (Signed)
Pt discharged safely with [place] driver.  All belongings were sent with patient. 
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
## 2018-08-08 NOTE — Progress Notes (Addendum)
D: [facility]. Patient has 4 grown children and several young children living with her. The patient was recently evicted from her home. She reports worsening depression due to life stressors and the final straw was the eviction notice. She wrote a long suicide note and prepared pills to take in an overdose attempt. She had a specific plan to take a shower, then take the pills in a suicide attempt. She reports poor concentration, insomnia, crying spells, and anxiety. She reports being diabetic, but "my sugar only goes low." Patient denies current SI and contracts for safety. The patient has no prior psychiatric hx. Patient has job working at [employer]. She is currently anemic Hgb 10.2 with low potassium K+3.1 A: Skin assessment performed per protocol, no contraband noted, and revealed abdominal scar from prior c-section, otherwise unremarkable. Patient belongings logged and placed in locker. Unit orientation completed. Care plan and unit routines reviewed with patient, understanding verbalized. Emotional support offered to patient. Encouraged patient to voice concerns. Fluids offered to patient. Q15 minute checks initiated for safety on and off unit.  R: Patient calm and cooperative at this time.

## 2018-08-08 NOTE — ED Notes (Signed)
Pt is very sad and tearful.  Pt was instructed to please provide urine.  15 minute checks and video monitoring in place.

## 2018-08-08 NOTE — BH Assessment (Signed)
BHH Assessment Progress Note  Per Donell Sievert, PA, this pt requires psychiatric hospitalization at this time.  Lamount Cranker, RN, [facility] has assigned pt to [facility] Rm 402-2.  Pt has reluctantly signed Voluntary Admission and Consent for Treatment, as well as Consent to Release Information to no one, and signed forms have been faxed to [facility].  Pt's nurse, Kendal Hymen, has been notified, and agrees to send original paperwork along with pt via Juel Burrow, and to call report to [phone].  Doylene Canning, [place] [facility] Coordinator [phone]

## 2018-08-08 NOTE — Plan of Care (Signed)
D: Patient is alert, oriented, pleasant, and cooperative. Denies SI, HI, AVH, and verbally contracts for safety. Patient reports that money/housing problems are her main stressor and worrying is the main symptom she is experiencing. Patient denies physical symptoms/pain.    A: Medications administered per MD order. Support provided. Patient educated on safety on the unit and medications. Routine safety checks every 15 minutes. Patient stated understanding to tell nurse about any new physical symptoms. Patient understands to tell staff of any needs.     R: No adverse drug reactions noted. Patient verbally contracts for safety. Patient remains safe at this time and will continue to monitor.   Problem: Education: Goal: Knowledge of [place] [facility] Education information/materials will improve Outcome: Progressing   Problem: Safety: Goal: Periods of time without injury will increase Outcome: Progressing   Patient oriented to the unit. Patient remains safe and will continue to monitor.

## 2018-08-08 NOTE — ED Notes (Signed)
Donell Sievert, PA, patient meets inpatient criteria. Hassie Bruce, accepted patient to [facility] Adult Unit pending a.m. discharges. Morning AC will coordinate. Randa Evens, RN, informed of disposition.

## 2018-08-08 NOTE — Tx Team (Signed)
Initial Treatment Plan 08/08/2018 2:37 PM Cheryl Gilbert BPZ:[id number]    PATIENT STRESSORS: Financial difficulties Loss of residence   PATIENT STRENGTHS: Ability for insight Capable of independent living Physical Health   PATIENT IDENTIFIED PROBLEMS: 1. "Money"  2. "Housing"                   DISCHARGE CRITERIA:  Adequate post-discharge living arrangements Improved stabilization in mood, thinking, and/or behavior  PRELIMINARY DISCHARGE PLAN: Outpatient therapy  PATIENT/FAMILY INVOLVEMENT: This treatment plan has been presented to and reviewed with the patient, [name].  The patient has been given the opportunity to ask questions and make suggestions.  Kirstie Mirza, RN 08/08/2018, 2:37 PM

## 2018-08-08 NOTE — BH Assessment (Addendum)
Assessment Note  Cheryl Gilbert is an [address]2 y.o. female presenting with SI with plan to overdose on pills. Patient was brought to ED voluntarily by [employer], a friend and her 42 year old daughter after she sent a text message to everyone stating "I am better off not living anymore" "goodbye" after feeling overwhelmed with eviction notice and unpaid bills. 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 Patient reported 42 year old daughter is the only one in the home. Patient reported receiving eviction notice approxiatemately 2 weeks ago, which is onset of SI and increased depression.  During assessment patient denies present SI stating "I don't think I really suicidal right now, I had a lot on my mine, trying to plan what's next and I was still trying to get up the money" Patient reported paying 1300.00 ordered by the court, after patient paid money then she had to pay an additional 1000.00 to catch up on rent, stating "I don't have that much, now I have to figure something out because they are coming to take our stuff on tomorrow". Patient reported having support system of family and friends. Patient denied receiving outpatient and inpatient mental health services. Patient denied history of SI attempts and self-harming behaviors. Patient denied HI, psychosis and alcohol/drug usage.   Patient was calm and cooperative during assessment. Patient was quiet/awake and oriented x4. Patient thought process was coherent and relevant. Patient mood/affect was depressed. Patient spoke logically and coherently with fair eye contact.  UDS in process ETOH negative  Diagnosis: Major depressive disorder  Past Medical History:  Past Medical History:  Diagnosis Date  . Anemia   . Asthma   . Eczema   . Hypothyroidism    "Not on meds since 1996"  . Migraine headache     Past Surgical History:  Procedure Laterality Date  . TUBAL  LIGATION      Family History:  Family History  Problem Relation Age of Onset  . Cancer Mother   . Mental illness Mother   . Multiple sclerosis Father   . Hypertension Maternal Grandmother   . Diabetes Maternal Grandfather   . Hypertension Maternal Grandfather     Social History:  reports that she has been smoking cigarettes. She has a 1.50 pack-year smoking history. She has never used smokeless tobacco. She reports that she does not drink alcohol or use drugs.  Additional Social History:  Alcohol / Drug Use Pain Medications: see MAR Prescriptions: see MAR Over the Counter: see MAR  CIWA: CIWA-Ar BP: 139/82 Pulse Rate: 78 COWS:    Allergies: No Known Allergies  Home Medications: (Not in a hospital admission)   OB/GYN Status:  No LMP recorded.  General Assessment Data Location of Assessment: [facility] ED TTS Assessment: In system Is this a Tele or Face-to-Face Assessment?: Face-to-Face Is this an Initial Assessment or a Re-assessment for this encounter?: Initial Assessment Patient Accompanied by:: N/A Language Other than English: No Living Arrangements: (family home) What gender do you identify as?: Female Marital status: Single Pregnancy Status: Unknown Living Arrangements: Children(42 year old daughter) Can pt return to current living arrangement?: Yes Admission Status: Voluntary Is patient capable of signing voluntary admission?: Yes Referral Source: Self/Family/Friend     Crisis Care Plan Living Arrangements: Children(42 year old daughter) Legal Guardian: (self) Name of Psychiatrist: (none) Name of Therapist: (none)  Education Status Is patient currently in school?: No Is the patient  employed, unemployed or receiving disability?: Employed  Risk to self with the past 6 months Suicidal Ideation: Yes-Currently Present Has patient been a risk to self within the past 6 months prior to admission? : Yes Suicidal Intent: Yes-Currently Present Has patient had any  suicidal intent within the past 6 months prior to admission? : Yes Is patient at risk for suicide?: Yes Suicidal Plan?: Yes-Currently Present Has patient had any suicidal plan within the past 6 months prior to admission? : Yes Specify Current Suicidal Plan: (overdose on pills) Access to Means: Yes Specify Access to Suicidal Means: (overdose on pills in the home) What has been your use of drugs/alcohol within the last 12 months?: (none) Previous Attempts/Gestures: No How many times?: (none) Other Self Harm Risks: (none) Triggers for Past Attempts: (n/a) Intentional Self Injurious Behavior: None Family Suicide History: Unknown Recent stressful life event(s): Financial Problems(eviction notice) Persecutory voices/beliefs?: No Depression: Yes Depression Symptoms: Insomnia, Tearfulness, Isolating, Fatigue, Guilt, Loss of interest in usual pleasures, Feeling worthless/self pity Substance abuse history and/or treatment for substance abuse?: No  Risk to Others within the past 6 months Homicidal Ideation: No Does patient have any lifetime risk of violence toward others beyond the six months prior to admission? : No Thoughts of Harm to Others: No Current Homicidal Intent: No Current Homicidal Plan: No Access to Homicidal Means: No History of harm to others?: No Assessment of Violence: None Noted Violent Behavior Description: (none) Does patient have access to weapons?: No Criminal Charges Pending?: No Does patient have a court date: No Is patient on probation?: No  Psychosis Hallucinations: None noted Delusions: None noted  Mental Status Report Appearance/Hygiene: Unremarkable Eye Contact: Fair Motor Activity: Freedom of movement Speech: Logical/coherent Level of Consciousness: Quiet/awake, Drowsy Mood: Depressed Affect: Depressed Anxiety Level: Moderate Thought Processes: Coherent, Relevant Judgement: Unimpaired Orientation: Person, Place, Time, Situation Obsessive  Compulsive Thoughts/Behaviors: None  Cognitive Functioning Concentration: Fair Memory: Recent Intact Is patient IDD: No Insight: Fair Impulse Control: Fair Appetite: Good Have you had any weight changes? : No Change Sleep: Decreased Total Hours of Sleep: ("not much") Vegetative Symptoms: None  ADLScreening [facility] Assessment Services) Patient's cognitive ability adequate to safely complete daily activities?: Yes Patient able to express need for assistance with ADLs?: Yes Independently performs ADLs?: Yes (appropriate for developmental age)  Prior Inpatient Therapy Prior Inpatient Therapy: No  Prior Outpatient Therapy Prior Outpatient Therapy: No Does patient have an ACCT team?: No Does patient have Intensive In-House Services?  : No Does patient have [facility] services? : No Does patient have P4CC services?: No  ADL Screening (condition at time of admission) Patient's cognitive ability adequate to safely complete daily activities?: Yes Patient able to express need for assistance with ADLs?: Yes Independently performs ADLs?: Yes (appropriate for developmental age)   Merchant navy officer (For Healthcare) Does Patient Have a Medical Advance Directive?: No  Disposition:  Disposition Initial Assessment Completed for this Encounter: Yes  Donell Sievert, PA, patient meets inpatient criteria. Hassie Bruce, accepted patient to [facility] Adult Unit pending a.m. discharges. Morning AC will coordinate. Randa Evens, RN, informed of disposition.   On Site Evaluation by:   Reviewed with Physician:    Burnetta Sabin, [facility] 08/08/2018 12:45 AM

## 2018-08-09 MED ORDER — SERTRALINE HCL 25 MG PO TABS: 25.0000 mg | ORAL_TABLET | Freq: Every day | ORAL | 0 refills | Status: AC

## 2018-08-09 MED ORDER — FAMOTIDINE 20 MG PO TABS: 20.0000 mg | ORAL_TABLET | Freq: Every day | ORAL | 0 refills | Status: AC

## 2018-08-09 MED ORDER — POTASSIUM CHLORIDE CRYS ER 20 MEQ PO TBCR: 20.0000 meq | EXTENDED_RELEASE_TABLET | Freq: Two times a day (BID) | ORAL | 0 refills | Status: AC

## 2018-08-09 MED ORDER — TRAZODONE HCL 50 MG PO TABS: 50.0000 mg | ORAL_TABLET | Freq: Every evening | ORAL | 0 refills | Status: AC | PRN

## 2018-08-09 MED ORDER — NICOTINE 7 MG/24HR TD PT24: 7.0000 mg | MEDICATED_PATCH | Freq: Every day | TRANSDERMAL | 0 refills | Status: AC

## 2018-08-09 NOTE — Tx Team (Signed)
Interdisciplinary Treatment and Diagnostic Plan Update  08/09/2018 Time of Session:  Cheryl Gilbert MRN: [id number]  Principal Diagnosis: MDD (major depressive disorder), severe (HCC)  Secondary Diagnoses: Principal Problem:   MDD (major depressive disorder), severe (HCC)   Current Medications:  Current Facility-Administered Medications  Medication Dose Route Frequency Provider Last Rate Last Dose  . acetaminophen (TYLENOL) tablet 650 mg  650 mg Oral Q6H PRN Kerry Hough, PA-C      . alum & mag hydroxide-simeth (MAALOX/MYLANTA) 200-200-20 MG/5ML suspension 30 mL  30 mL Oral Q4H PRN Kerry Hough, PA-C      . famotidine (PEPCID) tablet 20 mg  20 mg Oral Daily Antonieta Pert, MD   20 mg at 08/09/18 0802  . folic acid (FOLVITE) tablet 1 mg  1 mg Oral Daily Antonieta Pert, MD   1 mg at 08/09/18 0802  . hydrOXYzine (ATARAX/VISTARIL) tablet 25 mg  25 mg Oral TID PRN Kerry Hough, PA-C      . magnesium hydroxide (MILK OF MAGNESIA) suspension 30 mL  30 mL Oral Daily PRN Donell Sievert E, PA-C      . nicotine (NICODERM CQ - dosed in mg/24 hr) patch 7 mg  7 mg Transdermal Daily Antonieta Pert, MD   7 mg at 08/09/18 0801  . potassium chloride SA (K-DUR,KLOR-CON) CR tablet 20 mEq  20 mEq Oral BID Kerry Hough, PA-C   20 mEq at 08/09/18 0804  . sertraline (ZOLOFT) tablet 25 mg  25 mg Oral Daily Antonieta Pert, MD   25 mg at 08/09/18 0802  . thiamine (VITAMIN B-1) tablet 100 mg  100 mg Oral Daily Antonieta Pert, MD   100 mg at 08/09/18 0802  . traZODone (DESYREL) tablet 50 mg  50 mg Oral QHS,MR X 1 Kerry Hough, PA-C       Current Outpatient Medications  Medication Sig Dispense Refill  . [START ON 08/10/2018] famotidine (PEPCID) 20 MG tablet Take 1 tablet (20 mg total) by mouth daily. For acid reflux 15 tablet 0  . [START ON 08/10/2018] nicotine (NICODERM CQ - DOSED IN MG/24 HR) 7 mg/24hr patch Place 1 patch (7 mg total) onto the skin daily. (May  purchase from over the counter): For smoking cessation. 28 patch 0  . potassium chloride SA (K-DUR,KLOR-CON) 20 MEQ tablet Take 1 tablet (20 mEq total) by mouth 2 (two) times daily. Potassium replacement 6 tablet 0  . [START ON 08/10/2018] sertraline (ZOLOFT) 25 MG tablet Take 1 tablet (25 mg total) by mouth daily. For depression 30 tablet 0  . traZODone (DESYREL) 50 MG tablet Take 1 tablet (50 mg total) by mouth at bedtime as needed for sleep. 30 tablet 0   PTA Medications: No medications prior to admission.    Patient Stressors: Financial difficulties Loss of residence  Patient Strengths: Ability for insight Capable of independent living Physical Health  Treatment Modalities: Medication Management, Group therapy, Case management,  1 to 1 session with clinician, Psychoeducation, Recreational therapy.   Physician Treatment Plan for Primary Diagnosis: MDD (major depressive disorder), severe (HCC) Long Term Goal(s): Improvement in symptoms so as ready for discharge Improvement in symptoms so as ready for discharge   Short Term Goals: Ability to identify changes in lifestyle to reduce recurrence of condition will improve Ability to verbalize feelings will improve Ability to disclose and discuss suicidal ideas Ability to demonstrate self-control will improve Ability to identify and develop effective coping behaviors will improve Ability to maintain  clinical measurements within normal limits will improve Ability to identify changes in lifestyle to reduce recurrence of condition will improve Ability to verbalize feelings will improve Ability to disclose and discuss suicidal ideas Ability to demonstrate self-control will improve Ability to identify and develop effective coping behaviors will improve Ability to maintain clinical measurements within normal limits will improve  Medication Management: Evaluate patient's response, side effects, and tolerance of medication  regimen.  Therapeutic Interventions: 1 to 1 sessions, Unit Group sessions and Medication administration.  Evaluation of Outcomes: Adequate for Discharge  Physician Treatment Plan for Secondary Diagnosis: Principal Problem:   MDD (major depressive disorder), severe (HCC)  Long Term Goal(s): Improvement in symptoms so as ready for discharge Improvement in symptoms so as ready for discharge   Short Term Goals: Ability to identify changes in lifestyle to reduce recurrence of condition will improve Ability to verbalize feelings will improve Ability to disclose and discuss suicidal ideas Ability to demonstrate self-control will improve Ability to identify and develop effective coping behaviors will improve Ability to maintain clinical measurements within normal limits will improve Ability to identify changes in lifestyle to reduce recurrence of condition will improve Ability to verbalize feelings will improve Ability to disclose and discuss suicidal ideas Ability to demonstrate self-control will improve Ability to identify and develop effective coping behaviors will improve Ability to maintain clinical measurements within normal limits will improve     Medication Management: Evaluate patient's response, side effects, and tolerance of medication regimen.  Therapeutic Interventions: 1 to 1 sessions, Unit Group sessions and Medication administration.  Evaluation of Outcomes: Adequate for Discharge   RN Treatment Plan for Primary Diagnosis: MDD (major depressive disorder), severe (HCC) Long Term Goal(s): Knowledge of disease and therapeutic regimen to maintain health will improve  Short Term Goals: Ability to participate in decision making will improve, Ability to verbalize feelings will improve, Ability to disclose and discuss suicidal ideas, Ability to identify and develop effective coping behaviors will improve and Compliance with prescribed medications will improve  Medication  Management: RN will administer medications as ordered by provider, will assess and evaluate patient's response and provide education to patient for prescribed medication. RN will report any adverse and/or side effects to prescribing provider.  Therapeutic Interventions: 1 on 1 counseling sessions, Psychoeducation, Medication administration, Evaluate responses to treatment, Monitor vital signs and CBGs as ordered, Perform/monitor CIWA, COWS, AIMS and Fall Risk screenings as ordered, Perform wound care treatments as ordered.  Evaluation of Outcomes: Adequate for Discharge   LCSW Treatment Plan for Primary Diagnosis: MDD (major depressive disorder), severe (HCC) Long Term Goal(s): Safe transition to appropriate next level of care at discharge, Engage patient in therapeutic group addressing interpersonal concerns.  Short Term Goals: Engage patient in aftercare planning with referrals and resources  Therapeutic Interventions: Assess for all discharge needs, 1 to 1 time with Social worker, Explore available resources and support systems, Assess for adequacy in community support network, Educate family and significant other(s) on suicide prevention, Complete Psychosocial Assessment, Interpersonal group therapy.  Evaluation of Outcomes: Adequate for Discharge   Progress in Treatment: Attending groups: Yes. Participating in groups: Yes. Taking medication as prescribed: Yes. Toleration medication: Yes. Family/Significant other contact made: No, will contact:  patient declined collateral contacts Patient understands diagnosis: Yes. Discussing patient identified problems/goals with staff: Yes. Medical problems stabilized or resolved: Yes. Denies suicidal/homicidal ideation: Yes. Issues/concerns per patient self-inventory: No. Other:  New problem(s) identified:None   New Short Term/Long Term Goal(s): medication stabilization, elimination of SI thoughts,  development of comprehensive mental wellness  plan.    Patient Goals:   "I need resources on financial assistance"  Discharge Plan or Barriers: Patient discharged home with her adult children and plans to follow up with [facility] for outpatient medication management and therapy services.   Reason for Continuation of Hospitalization: None   Estimated Length of Stay: Discharged, 08/09/2018  Attendees: Patient: 08/09/2018 2:12 PM  Physician: Dr. Landry Mellow, MD 08/09/2018 2:12 PM  Nursing: Alexia Freestone.D, RN 08/09/2018 2:12 PM  RN Care Manager: 08/09/2018 2:12 PM  Social Worker: Baldo Daub, LCSWA 08/09/2018 2:12 PM  Recreational Therapist:  08/09/2018 2:12 PM  Other: Marciano Sequin, NP  08/09/2018 2:12 PM  Other:  08/09/2018 2:12 PM  Other: 08/09/2018 2:12 PM    Scribe for Treatment Team: [name], LCSWA 08/09/2018 2:12 PM

## 2018-08-09 NOTE — [facility] Suicide Risk Assessment (Signed)
[facility] Discharge Suicide Risk Assessment   Principal Problem: <principal problem not specified> Discharge Diagnoses: Active Problems:   MDD (major depressive disorder), severe (HCC)   Total Time spent with patient: 15 minutes  Musculoskeletal: Strength & Muscle Tone: within normal limits Gait & Station: normal Patient leans: N/A  Psychiatric Specialty Exam: Review of Systems  All other systems reviewed and are negative.   Blood pressure 119/79, pulse 85, temperature 98.1 F (36.7 C), temperature source Oral, resp. rate 20, height 5\' 5"  (1.651 m), weight 86.2 kg, last menstrual period 07/05/2018, SpO2 100 %.Body mass index is 31.62 kg/m.  General Appearance: Casual  Eye Contact::  Good  Speech:  Normal Rate409  Volume:  Normal  Mood:  Euthymic  Affect:  Congruent  Thought Process:  Coherent and Descriptions of Associations: Intact  Orientation:  Full (Time, Place, and Person)  Thought Content:  Logical  Suicidal Thoughts:  No  Homicidal Thoughts:  No  Memory:  Immediate;   Fair Recent;   Fair Remote;   Fair  Judgement:  Intact  Insight:  Fair  Psychomotor Activity:  Normal  Concentration:  Good  Recall:  Good  Fund of Knowledge:Good  Language: Good  Akathisia:  Negative  Handed:  Right  AIMS (if indicated):     Assets:  Communication Skills Desire for Improvement Physical Health Resilience  Sleep:     Cognition: WNL  ADL's:  Intact   Mental Status Per Nursing Assessment::   On Admission:  Suicidal ideation indicated by patient, Self-harm thoughts, Belief that plan would result in death  Demographic Factors:  Divorced or widowed and Low socioeconomic status  Loss Factors: Financial problems/change in socioeconomic status  Historical Factors: Impulsivity  Risk Reduction Factors:   Sense of responsibility to family, Employed, Living with another person, especially a relative and Positive social support  Continued Clinical Symptoms:  Depression:    Impulsivity  Cognitive Features That Contribute To Risk:  None    Suicide Risk:  Minimal: No identifiable suicidal ideation.  Patients presenting with no risk factors but with morbid ruminations; may be classified as minimal risk based on the severity of the depressive symptoms    Plan Of Care/Follow-up recommendations:  Activity:  ad lib  Antonieta Pert, MD 08/09/2018, 9:42 AM

## 2018-08-09 NOTE — [facility] Counselor (Signed)
Patient was admitted to [facility], Thursday, 08/08/2018 and was discharged on Friday, 08/09/2018.  CSW was unable to complete psycho-social assessment prior to the patient's discharge.    Baldo Daub, MSW, LCSWA Clinical Social Worker [facility]  Phone: [phone]

## 2018-08-09 NOTE — Discharge Summary (Signed)
Physician Discharge Summary Note  Patient:  Cheryl Gilbert is an 43 y.o., female  MRN:  [id number]  DOB:  1975/12/28  Patient phone:  [phone] (home)   Patient address:   [address] [place] [address],   Total Time spent with patient: Greater than 30 minutes  Date of Admission:  08/08/2018  Date of Discharge: 08-09-18  Reason for Admission: Worsening suicidal ideations induced by financial stressors.  Principal Problem: MDD (major depressive disorder), severe (HCC)  Discharge Diagnoses: Principal Problem:   MDD (major depressive disorder), severe (HCC)  Past Psychiatric History: Major depressive disorder.  Past Medical History:  Past Medical History:  Diagnosis Date  . Anemia   . Asthma   . Eczema   . Hypoglycemia associated with diabetes (HCC)   . Hypothyroidism    "Not on meds since 1996"  . Migraine headache     Past Surgical History:  Procedure Laterality Date  . TUBAL LIGATION     Family History:  Family History  Problem Relation Age of Onset  . Cancer Mother   . Mental illness Mother   . Multiple sclerosis Father   . Hypertension Maternal Grandmother   . Diabetes Maternal Grandfather   . Hypertension Maternal Grandfather    Family Psychiatric  History: See H&P Social History:  Social History   Substance and Sexual Activity  Alcohol Use Yes   Comment: occasional alcohol use twice a year     Social History   Substance and Sexual Activity  Drug Use No    Social History   Socioeconomic History  . Marital status: Single    Spouse name: Not on file  . Number of children: Not on file  . Years of education: Not on file  . Highest education level: Not on file  Occupational History  . Not on file  Social Needs  . Financial resource strain: Not on file  . Food insecurity:    Worry: Not on file    Inability: Not on file  . Transportation needs:    Medical: Not on file    Non-medical: Not on file  Tobacco Use   . Smoking status: Current Every Day Smoker    Packs/day: 0.25    Years: 6.00    Pack years: 1.50    Types: Cigarettes  . Smokeless tobacco: Never Used  Substance and Sexual Activity  . Alcohol use: Yes    Comment: occasional alcohol use twice a year  . Drug use: No  . Sexual activity: Yes    Birth control/protection: Condom  Lifestyle  . Physical activity:    Days per week: Not on file    Minutes per session: Not on file  . Stress: Not on file  Relationships  . Social connections:    Talks on phone: Not on file    Gets together: Not on file    Attends religious service: Not on file    Active member of club or organization: Not on file    Attends meetings of clubs or organizations: Not on file    Relationship status: Not on file  Other Topics Concern  . Not on file  Social History Narrative  . Not on file   Hospital Course: (Per admission evaluation): Patient is a 43 year old female with a remote past psychiatric history significant for depression who [facility] department on 08/07/2018 with suicidal ideation. The patient was brought to the emergency department [place] [facility] [place], a friend  and her 41 year old daughter. Apparently the patient had been having significant financial problems, and was falling further behind. Her power was turned off at one time, and yesterday she got an eviction notice. She stated she felt overwhelmed by that and sent out to "I am better off not living anymore", "goodbye". 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 She lives with her 42 year old child, and her 40 year old child lives with the grandparent. She originally received the eviction notice approximately 2 weeks ago, but apparently they refused that payment. She felt overwhelmed and exhausted because of her work schedule as  well as falling further behind on her bills. She denied any excessive spending or manic symptoms. She denied any substance abuse. She stated that she had had an episode of postpartum depression over 20 years ago with 1 of her children. She was hospitalized for a short period of time, but was discharged on no medications. She reported no family history of suicide in her family. She admitted to being frustrated, feeling overwhelmed, feeling helpless and hopeless with regard to her current life situation. She was admitted to the hospital for evaluation and stabilization.  This is a very brief hospital stay for this 43 year old AA female with previous psychiatric hx & hx of brief psychiatric hospitalization, discharged with medications at the time per chart review results.  Apparently, patient was brought to the ED by her friend & 17 year old daughter after she expressed suicidal ideations with plans to overdose on medications. This SI were triggered by financial related stressors (unpaid bills) & eviction notice to vacate her apartment. While at the ED, chart review reports indicated that patient stated that she was no longer feeling suicidal. While at the ED, she denied feeling depressed, suicidal/homicidal ideations. She denies any hx of suicide attempts. She has asked to be discharged from the ED then, but was sent to the [facility] voluntarily for a more thorough psychiatric evaluation to assure her safety.  During the above admission evaluation with the attending psychiatrist this am, [name] reports that she is doing well. She presented with a good affect, good judgement, regretful of her over reaction to her financial issues. She was making good eye contact. She denies any serious mental health issues. She denies any suicidal/homicidal ideations, AVH, delusional thoughts or paranoia. There are no documented hx of suicide attempts by this patient or any familial hx of completed suicide attempt. She lives  with her 2 children & says she has the support of her family (children). Assessments so far has revealed that patient does not have or own a gun & has no access to any guns or other weapons. She is seriously asking to be discharged to her home with family to go & figure out her current financial burden. Chart review has shown that patient's daughter & friend came to the ED with her & has been supportive.  Although with a serious financial stressors that triggered her suicidal ideations with plans, patient continues to endorse that it was rather an impulsive thoughts. She says she has the support of her family & should have reached out to them first. She says she has learned a valuable lesson from this & she loves & cares for her children & would not leave them starnded. Besides, she says although with some financial stressors, she does have a very genuine reason to live &  that is her family (children).  And because there are no clinical criteria to keep this patient admitted to the hospital, she will be discharged as requested in the care/support of her family. She is being discharged with some medications. She is being discharged with the resumption of her pertinent medications for her other medical issues. However, she is cautioned, instructed & encouraged to reach out to her outpatient provider(s) on their recommendation on how to continue her current medications for her other medical issues.  Per the social worker: Suicide Prevention Education:  Patient Refusal for Family/Significant Other Suicide Prevention Education: The patient Lorie Gilbert has refused to provide written consent for family/significant other to be provided Family/Significant Other Suicide Prevention Education during admission and/or prior to discharge. Physician notified.  Suicide Prevention Education completed with patient, as patient refused to consent to family contact. SPI pamphlet provided to pt and pt was  encouraged to share information with support network, ask questions, and talk about any concerns relating to SPE. Patient denies access to guns/firearms and verbalized understanding of information provided. Mobile Crisis information also provided to patient.  In the event of worsening symptoms, patient is instructed/cautioned to call the crisis hotline, 911 and or go to the nearest ED for appropriate evaluation and treatment of her symptoms. Vandy verbalized her understanding & left [facility] with all personal belongings in no apparent distress. Transportation per daughter.  Physical Findings: AIMS: Facial and Oral Movements Muscles of Facial Expression: None, normal Lips and Perioral Area: None, normal Jaw: None, normal Tongue: None, normal,Extremity Movements Upper (arms, wrists, hands, fingers): None, normal Lower (legs, knees, ankles, toes): None, normal, Trunk Movements Neck, shoulders, hips: None, normal, Overall Severity Severity of abnormal movements (highest score from questions above): None, normal Incapacitation due to abnormal movements: None, normal Patient's awareness of abnormal movements (rate only patient's report): No Awareness, Dental Status Current problems with teeth and/or dentures?: No Does patient usually wear dentures?: No  CIWA:  CIWA-Ar Total: 0 COWS:  COWS Total Score: 0  Musculoskeletal: Strength & Muscle Tone: within normal limits Gait & Station: normal Patient leans: N/A  Psychiatric Specialty Exam: Physical Exam  Nursing note and vitals reviewed. Constitutional: She is oriented to person, place, and time. She appears well-developed.  HENT:  Head: Normocephalic.  Eyes: Pupils are equal, round, and reactive to light.  Cardiovascular: Normal rate.  Respiratory: Effort normal.  GI: Soft.  Genitourinary:    Genitourinary Comments: Deferred   Musculoskeletal: Normal range of motion.  Neurological: She is alert and oriented to person, place, and time.   Skin: Skin is warm and dry.    Review of Systems  Constitutional: Negative.   HENT: Negative.   Eyes: Negative.   Respiratory: Negative.  Negative for cough and shortness of breath.   Cardiovascular: Negative.  Negative for chest pain and palpitations.  Gastrointestinal: Negative.  Negative for abdominal pain, heartburn, nausea and vomiting.  Genitourinary: Negative.   Skin: Negative.   Neurological: Negative.  Negative for dizziness and headaches.  Endo/Heme/Allergies: Negative.   Psychiatric/Behavioral: Positive for depression (Stable). Negative for hallucinations, memory loss, substance abuse and suicidal ideas. The patient is not nervous/anxious and does not have insomnia.     Blood pressure 119/79, pulse 85, temperature 98.1 F (36.7 C), temperature source Oral, resp. rate 20, height 5\' 5"  (1.651 m), weight 86.2 kg, last menstrual period 07/05/2018, SpO2 100 %.Body mass index is 31.62 kg/m.  See Md's discharge SRA   Have you used any form of tobacco in  the last 30 days? (Cigarettes, Smokeless Tobacco, Cigars, and/or Pipes): Yes  Has this patient used any form of tobacco in the last 30 days? (Cigarettes, Smokeless Tobacco, Cigars, and/or Pipes):Yes, an FDA-approved tobacco cessation medication was offered at discharge.  Blood Alcohol level:  Lab Results  Component Value Date   ETH <10 08/07/2018   Metabolic Disorder Labs:  No results found for: HGBA1C, MPG No results found for: PROLACTIN No results found for: CHOL, TRIG, HDL, CHOLHDL, VLDL, LDLCALC  See Psychiatric Specialty Exam and Suicide Risk Assessment completed by Attending Physician prior to discharge.  Discharge destination:  Home  Is patient on multiple antipsychotic therapies at discharge:  No   Has Patient had three or more failed trials of antipsychotic monotherapy by history:  No  Recommended Plan for Multiple Antipsychotic Therapies: NA  Allergies as of 08/09/2018   No Known Allergies     Medication  List    STOP taking these medications   HYDROcodone-acetaminophen 5-325 MG tablet Commonly known as:  NORCO/VICODIN   methocarbamol 500 MG tablet Commonly known as:  ROBAXIN   naproxen 375 MG tablet Commonly known as:  NAPROSYN     TAKE these medications     Indication  famotidine 20 MG tablet Commonly known as:  PEPCID Take 1 tablet (20 mg total) by mouth daily. For acid reflux Start taking on:  August 10, 2018  Indication:  Gastroesophageal Reflux Disease   nicotine 7 mg/24hr patch Commonly known as:  NICODERM CQ - dosed in mg/24 hr Place 1 patch (7 mg total) onto the skin daily. (May purchase from over the counter): For smoking cessation. Start taking on:  August 10, 2018  Indication:  Nicotine Addiction   potassium chloride SA 20 MEQ tablet Commonly known as:  K-DUR,KLOR-CON Take 1 tablet (20 mEq total) by mouth 2 (two) times daily. Potassium replacement  Indication:  Low Amount of Potassium in the Blood   sertraline 25 MG tablet Commonly known as:  ZOLOFT Take 1 tablet (25 mg total) by mouth daily. For depression Start taking on:  August 10, 2018  Indication:  Major Depressive Disorder   traZODone 50 MG tablet Commonly known as:  DESYREL Take 1 tablet (50 mg total) by mouth at bedtime as needed for sleep.  Indication:  Trouble Sleeping      Follow-up Information    [facility] Follow up.   Why:  Hospital follow up appointment is  Please bring your photo ID, proof of insurance, current medications, and discharge paperwork from this hospitalization.  Contact information: [address] [place] [address] [phone]          Follow-up recommendations: Activity:  As tolerated Diet: As recommended by your primary care doctor. Keep all scheduled follow-up appointments as recommended.   Comments: Patient is instructed prior to discharge to: Take all medications as prescribed by his/her mental healthcare provider. Report any adverse effects and or  reactions from the medicines to his/her outpatient provider promptly. Patient has been instructed & cautioned: To not engage in alcohol and or illegal drug use while on prescription medicines. In the event of worsening symptoms, patient is instructed to call the crisis hotline, 911 and or go to the nearest ED for appropriate evaluation and treatment of symptoms. To follow-up with his/her primary care provider for your other medical issues, concerns and or health care needs.   Signed: Armandina StammerAgnes Amarea Macdowell, NP, PMHNP, FNP-BC 08/09/2018, 11:32 AM

## 2018-08-09 NOTE — Progress Notes (Signed)
Recreation Therapy Notes  Date:  2.14.19 Time: 0930 Location: [address] Dayroom  Group Topic: Stress Management  Goal Area(s) Addresses:  Patient will identify positive stress management techniques. Patient will identify benefits of using stress management post d/c.  Intervention: Stress Management  Activity :  Meditation.  LRT introduced the stress management technique of meditation.  LRT played a meditation on having a purposeful day.  Patients were to listen and follow along as meditation played.  Education:  Stress Management, Discharge Planning.   Education Outcome: Acknowledges Education  Clinical Observations/Feedback: Pt did not attend group.     Lazlo Tunney, LRT/CTRS         Lexandra Rettke A 08/09/2018 12:20 PM 

## 2018-08-09 NOTE — Progress Notes (Signed)
Pt completed daily assessment and on this she wrote she denied having SI today and she rated her depression, hopelessness and anxiety " 2/0/0/", respectively . DC teaching was completed by Roddie Mc and pt stated verbal understanding. Pt given all cc of dc instrucitons ( SRA, AVS, SSP and transition record) and all belongings were returned to pt. Pt escorted to bldg entrance and dc'd.

## 2018-08-09 NOTE — [facility] Suicide Risk Assessment (Signed)
[facility] INPATIENT:  Family/Significant Other Suicide Prevention Education  Suicide Prevention Education:  Patient Refusal for Family/Significant Other Suicide Prevention Education: The patient Cheryl Gilbert has refused to provide written consent for family/significant other to be provided Family/Significant Other Suicide Prevention Education during admission and/or prior to discharge.  Physician notified.  SPE completed with patient, as patient refused to consent to family contact. SPI pamphlet provided to pt and pt was encouraged to share information with support network, ask questions, and talk about any concerns relating to SPE. Patient denies access to guns/firearms and verbalized understanding of information provided. Mobile Crisis information also provided to patient.    [name] 08/09/2018, 10:40 AM

## 2018-08-09 NOTE — Progress Notes (Signed)
  [facility] Adult Case Management Discharge Plan :  Will you be returning to the same living situation after discharge:  Yes,  patient reports she is returning home to her module home At discharge, do you have transportation home?: Yes,  patient reports her adult daughter is picking her up at discharge Do you have the ability to pay for your medications: No.  Release of information consent forms completed and in the chart;  Patient's signature needed at discharge.  Patient to Follow up at: Follow-up Information    [facility] Follow up on 08/12/2018.   Why:  Hospital follow up appointment is Monday, 08/12/2018 at 8:00am.  Please bring your photo ID, proof of insurance, current medications, and discharge paperwork from this hospitalization. CSW will call and notify patient.  Contact information: [address] [place] [address] [phone]           Next level of care provider has access to [facility] Link:yes  Safety Planning and Suicide Prevention discussed: Yes,  with the patient   Have you used any form of tobacco in the last 30 days? (Cigarettes, Smokeless Tobacco, Cigars, and/or Pipes): Yes  Has patient been referred to the Quitline?: Patient refused referral  Patient has been referred for addiction treatment: N/A  [name], LCSWA 08/09/2018, 1:56 PM

## 2019-12-08 ENCOUNTER — Emergency Department (HOSPITAL_COMMUNITY): Payer: Self-pay

## 2019-12-08 ENCOUNTER — Encounter (HOSPITAL_COMMUNITY): Payer: Self-pay | Admitting: Emergency Medicine

## 2019-12-08 ENCOUNTER — Other Ambulatory Visit: Payer: Self-pay

## 2019-12-08 ENCOUNTER — Emergency Department (HOSPITAL_COMMUNITY)
Admission: EM | Admit: 2019-12-08 | Discharge: 2019-12-08 | Disposition: A | Discharge status: Home / Self Care | Payer: Self-pay | Attending: Emergency Medicine | Admitting: Emergency Medicine

## 2019-12-08 DIAGNOSIS — J45909 Unspecified asthma, uncomplicated: Secondary | ICD-10-CM | POA: Insufficient documentation

## 2019-12-08 DIAGNOSIS — M7731 Calcaneal spur, right foot: Secondary | ICD-10-CM

## 2019-12-08 DIAGNOSIS — F17210 Nicotine dependence, cigarettes, uncomplicated: Secondary | ICD-10-CM | POA: Insufficient documentation

## 2019-12-08 DIAGNOSIS — M25561 Pain in right knee: Secondary | ICD-10-CM

## 2019-12-08 DIAGNOSIS — Z79899 Other long term (current) drug therapy: Secondary | ICD-10-CM | POA: Insufficient documentation

## 2019-12-08 DIAGNOSIS — E039 Hypothyroidism, unspecified: Secondary | ICD-10-CM | POA: Insufficient documentation

## 2019-12-08 LAB — POC URINE PREG, ED: Preg Test, Ur: NEGATIVE

## 2019-12-08 NOTE — Progress Notes (Signed)
Orthopedic Tech Progress Note Patient Details:  Cheryl Gilbert 1976-02-13 [phone] Knee was too large. Used ace wrap to replace sleeve.  Ortho Devices Type of Ortho Device: Ace wrap, Crutches Ortho Device/Splint Location: LRE Ortho Device/Splint Interventions: Application, Ordered   Post Interventions Patient Tolerated: Well Instructions Provided: Care of device   Aronda Burford A Luva Metzger 12/08/2019, 8:32 PM

## 2019-12-08 NOTE — ED Notes (Signed)
Patient transported to X-ray 

## 2019-12-08 NOTE — ED Provider Notes (Signed)
[facility] EMERGENCY DEPARTMENT Provider Note   CSN: [id number] Arrival date & time: 12/08/19  1534     History Chief Complaint  Patient presents with  . Leg Pain    Cheryl Gilbert is a 44 y.o. female presents today for right knee pain that began 1 week ago no known injury.  Patient reports that whenever she moves her right knee she feels a internal pop, she reports a small amount of sharp pain nonradiating improved with rest.  Symptoms have been constant whenever she moves her leg for the past 1 week.  Patient also feels that she is having a popping sensation in her right ankle and right hip as well.   Denies fall/injury, fever/chills, numbness/tingling, weakness, extremity swelling/color change, bowel/bladder incontinence, urinary retention, saddle paresthesias, joint swelling or any additional concerns.  HPI     Past Medical History:  Diagnosis Date  . Anemia   . Asthma   . Eczema   . Hypoglycemia associated with diabetes (HCC)   . Hypothyroidism    "Not on meds since 1996"  . Migraine headache     Patient Active Problem List   Diagnosis Date Noted  . MDD (major depressive disorder), severe (HCC) 08/08/2018    Past Surgical History:  Procedure Laterality Date  . TUBAL LIGATION       OB History    Gravida  7   Para  5   Term  3   Preterm  2   AB  2   Living  5     SAB  2   TAB  0   Ectopic  0   Multiple  0   Live Births  5           Family History  Problem Relation Age of Onset  . Cancer Mother   . Mental illness Mother   . Multiple sclerosis Father   . Hypertension Maternal Grandmother   . Diabetes Maternal Grandfather   . Hypertension Maternal Grandfather     Social History   Tobacco Use  . Smoking status: Current Every Day Smoker    Packs/day: 0.25    Years: 6.00    Pack years: 1.50    Types: Cigarettes  . Smokeless tobacco: Never Used  Substance Use Topics  . Alcohol use: Yes    Comment:  occasional alcohol use twice a year  . Drug use: No    Home Medications Prior to Admission medications   Medication Sig Start Date End Date Taking? Authorizing Provider  famotidine (PEPCID) 20 MG tablet Take 1 tablet (20 mg total) by mouth daily. For acid reflux 08/10/18   Armandina Stammer I, NP  nicotine (NICODERM CQ - DOSED IN MG/24 HR) 7 mg/24hr patch Place 1 patch (7 mg total) onto the skin daily. (May purchase from over the counter): For smoking cessation. 08/10/18   Armandina Stammer I, NP  potassium chloride SA (K-DUR,KLOR-CON) 20 MEQ tablet Take 1 tablet (20 mEq total) by mouth 2 (two) times daily. Potassium replacement 08/09/18   Armandina Stammer I, NP  sertraline (ZOLOFT) 25 MG tablet Take 1 tablet (25 mg total) by mouth daily. For depression 08/10/18   Armandina Stammer I, NP  traZODone (DESYREL) 50 MG tablet Take 1 tablet (50 mg total) by mouth at bedtime as needed for sleep. 08/09/18   Sanjuana Kava, NP    Allergies    Patient has no known allergies.  Review of Systems   Review of Systems  Constitutional:  Negative.  Negative for chills and fever.  Musculoskeletal: Positive for arthralgias. Negative for back pain, joint swelling and neck pain.  Skin: Negative.  Negative for wound.  Neurological: Negative.  Negative for weakness and numbness.       Denies saddle or paresthesias  Denies bowel/bladder incontinence Denies urinary retention     Physical Exam Updated Vital Signs BP 101/75 (BP Location: Left Wrist)   Pulse 67   Temp 98.2 F (36.8 C) (Oral)   Resp 18   Ht 5\' 5"  (1.651 m)   Wt 99.8 kg   LMP 11/25/2019   SpO2 98%   BMI 36.61 kg/m   Physical Exam Constitutional:      General: She is not in acute distress.    Appearance: Normal appearance. She is well-developed. She is not ill-appearing or diaphoretic.  HENT:     Head: Normocephalic and atraumatic.  Eyes:     General: Vision grossly intact. Gaze aligned appropriately.     Pupils: Pupils are equal, round, and reactive to  light.  Neck:     Trachea: Trachea and phonation normal.  Cardiovascular:     Pulses:          Dorsalis pedis pulses are 2+ on the right side and 2+ on the left side.  Pulmonary:     Effort: Pulmonary effort is normal. No respiratory distress.  Abdominal:     General: There is no distension.     Palpations: Abdomen is soft.     Tenderness: There is no abdominal tenderness. There is no guarding or rebound.  Musculoskeletal:        General: Normal range of motion.     Cervical back: Normal range of motion.     Right hip: No tenderness. Normal range of motion. Normal strength.     Left hip: No tenderness. Normal range of motion. Normal strength.     Right upper leg: Normal.     Left upper leg: Normal.     Right knee: No swelling, deformity, erythema, ecchymosis, bony tenderness or crepitus. Tenderness present over the patellar tendon.     Left knee: Normal. No tenderness.     Right lower leg: Normal.     Left lower leg: Normal.     Right ankle: Normal.     Right Achilles Tendon: Normal.     Left ankle: Normal.     Left Achilles Tendon: Normal.     Right foot: Normal.     Left foot: Normal.  Feet:     Right foot:     Protective Sensation: 5 sites tested. 5 sites sensed.     Skin integrity: Skin integrity normal.     Left foot:     Protective Sensation: 5 sites tested. 5 sites sensed.     Skin integrity: Skin integrity normal.  Skin:    General: Skin is warm and dry.  Neurological:     Mental Status: She is alert.     GCS: GCS eye subscore is 4. GCS verbal subscore is 5. GCS motor subscore is 6.     Comments: Speech is clear and goal oriented, follows commands Major Cranial nerves without deficit, no facial droop Normal strength in upper and lower extremities bilaterally including dorsiflexion and plantar flexion, strong and equal grip strength Sensation normal to light and sharp touch   Psychiatric:        Behavior: Behavior normal.     ED Results / Procedures /  Treatments   Labs (all  labs ordered are listed, but only abnormal results are displayed) Labs Reviewed  POC URINE PREG, ED    EKG None  Radiology DG Ankle Complete Right  Result Date: 12/08/2019 CLINICAL DATA:  Ankle pain for 1 week EXAM: RIGHT ANKLE - COMPLETE 3+ VIEW COMPARISON:  None. FINDINGS: Subcutaneous edema. No fracture or malalignment. Small plantar calcaneal spur. IMPRESSION: Soft tissue edema.  No acute osseous abnormality Electronically Signed   By: Jasmine Pang M.D.   On: 12/08/2019 19:40   DG Knee Complete 4 Views Right  Result Date: 12/08/2019 CLINICAL DATA:  Acute right knee pain without known injury. EXAM: RIGHT KNEE - COMPLETE 4+ VIEW COMPARISON:  None. FINDINGS: No evidence of fracture, dislocation, or joint effusion. No evidence of arthropathy or other focal bone abnormality. Soft tissues are unremarkable. IMPRESSION: Negative. Electronically Signed   By: Lupita Raider M.D.   On: 12/08/2019 19:40    Procedures Procedures (including critical care time)  Medications Ordered in ED Medications - No data to display  ED Course  I have reviewed the triage vital signs and the nursing notes.  Pertinent labs & imaging results that were available during my care of the patient were reviewed by me and considered in my medical decision making (see chart for details).    MDM Rules/Calculators/A&P                         Additional History Obtained: 1. Nursing notes from this visit. 2. No pertinent recent ER visits. - Patient presents with pain and popping of the right knee and right ankle, additionally with some pain at the right hip.  No history of injury, no fever or recent illnesses.  She has no back pain to suggest sciatica and no other symptoms concerning for cauda equina or nerve compression.  She has tenderness along the patella without overlying skin changes.  Good range of motion and strength at the right knee.  She also has good range of motion and strength of  the right hip and actually denies any hip pain during my evaluation.  Patient does report some nonspecific right ankle and right heel pain as well but reports this is mild and only with ambulation.  Overall she is well-appearing in no acute distress there is no sign of trauma.  I offered patient x-ray imaging of her hip knee and ankle, she refuses hip x-ray wishes to proceed with right knee and right ankle x-ray. - Urine pregnancy test negative.  DG Right Knee:  IMPRESSION:  Negative.  I have personally reviewed patient's right knee x-ray and agree with radiologist interpretation.  No fracture or dislocation per my interpretation.  DG Right Ankle:    IMPRESSION:  Soft tissue edema. No acute osseous abnormality  I have personally reviewed patient's left ankle x-ray.  Feel that the edema noted by radiology is likely secondary to patient's body habitus.  There is no difference on examination between the 2 extremities.  Likely patient's left heel pain may be related to her calcaneal spur noted on x-ray.  Per my interpretation no fractures or dislocations. - Plan of care is rice therapy and orthopedic follow-up as needed.  Suspect ligamentous versus meniscal versus tendon etiology of her symptoms today.  No evidence of DVT, compartment syndrome, septic arthritis, cellulitis, neurovascular compromise or other emergent pathologies.  Additionally patient symptoms are not consistent with sciatica or nerve impingement.  No gross ligamentous laxity and patient with steady gait.  Patient  was reevaluated she is resting comfortably in bed using her phone.  She states understanding of findings as above and has no questions.  She is requesting crutches and a knee sleeve.  At this time there does not appear to be any evidence of an acute emergency medical condition and the patient appears stable for discharge with appropriate outpatient follow up. Diagnosis was discussed with patient who verbalizes understanding  of care plan and is agreeable to discharge. I have discussed return precautions with patient who verbalizes understanding. Patient encouraged to follow-up with their PCP and Ortho. All questions answered.   Note: Portions of this report may have been transcribed using voice recognition software. Every effort was made to ensure accuracy; however, inadvertent computerized transcription errors may still be present. Final Clinical Impression(s) / ED Diagnoses Final diagnoses:  Acute pain of right knee  Calcaneal spur of right foot    Rx / DC Orders ED Discharge Orders    None       Gari Crown 12/08/19 2005    [address] [place], DO 12/09/19 0023

## 2019-12-08 NOTE — ED Notes (Signed)
Have called ortho tech for crutches

## 2019-12-08 NOTE — ED Triage Notes (Signed)
Pt c/o right hip pain, right knee popping and right heal pain. Pain has gotten progressively worse. Denies injury/fall.

## 2019-12-08 NOTE — Discharge Instructions (Addendum)
At this time there does not appear to be the presence of an emergent medical condition, however there is always the potential for conditions to change. Please read and follow the below instructions.  Please return to the Emergency Department immediately for any new or worsening symptoms. Please be sure to follow up with your Primary Care Provider within one week regarding your visit today; please call their office to schedule an appointment even if you are feeling better for a follow-up visit. Your right ankle x-ray today showed some swelling and a small plantar calcaneal spur.  Please discuss these findings with your primary care doctor and the orthopedic specialist at your follow-up appointment.  Your right knee x-ray was negative for acute findings today however soft tissue injury to the meniscus, ligaments or tendons or other unseen injuries may be present.  Additionally unseen fractures may be present today so follow-up with your primary care doctor and orthopedic specialist is recommended for further evaluation.  You may use the crutches as needed to help keep weight off of your heel.  Use rest ice and elevation to help with pain.  Please call the orthopedic specialist Dr. Eulah Pont on your discharge paperwork to schedule a follow-up appointment.  Get help right away if: Your knee swells, and the swelling gets worse. You cannot move your knee. You have very bad knee pain. You have fever or chills You have redness or warmth of your joints You have any new/concerning or worsening of symptoms  Please read the additional information packets attached to your discharge summary.  Do not take your medicine if  develop an itchy rash, swelling in your mouth or lips, or difficulty breathing; call 911 and seek immediate emergency medical attention if this occurs.  Note: Portions of this text may have been transcribed using voice recognition software. Every effort was made to ensure accuracy; however,  inadvertent computerized transcription errors may still be present.

## 2021-01-25 ENCOUNTER — Encounter (HOSPITAL_COMMUNITY): Payer: Self-pay

## 2021-01-25 ENCOUNTER — Ambulatory Visit (HOSPITAL_COMMUNITY)
Admission: EM | Admit: 2021-01-25 | Discharge: 2021-01-25 | Disposition: A | Discharge status: Home / Self Care | Payer: Self-pay

## 2021-01-25 ENCOUNTER — Other Ambulatory Visit: Payer: Self-pay

## 2021-01-25 DIAGNOSIS — M5431 Sciatica, right side: Secondary | ICD-10-CM

## 2021-01-25 LAB — POCT URINALYSIS DIPSTICK, ED / UC
Bilirubin Urine: NEGATIVE
Glucose, UA: NEGATIVE mg/dL
Hgb urine dipstick: NEGATIVE
Leukocytes,Ua: NEGATIVE
Nitrite: NEGATIVE
Protein, ur: NEGATIVE mg/dL
Specific Gravity, Urine: 1.025 (ref 1.005–1.030)
Urobilinogen, UA: 1 mg/dL (ref 0.0–1.0)
pH: 5.5 (ref 5.0–8.0)

## 2021-01-25 LAB — POC URINE PREG, ED: Preg Test, Ur: NEGATIVE

## 2021-01-25 MED ORDER — PREDNISONE 20 MG PO TABS: 40.0000 mg | ORAL_TABLET | Freq: Every day | ORAL | 0 refills | Status: AC

## 2021-01-25 NOTE — ED Triage Notes (Signed)
Pt reports right sided lower back pain, radiates to right lower quadrat and rig leg x 1 week. Pain is worse cough, sneeze, laugh./ Denies fever, diarrhea, nausea, vomiting, dysuria.

## 2021-01-25 NOTE — Discharge Instructions (Addendum)
Take the prednisone daily for the next 5 days.    You can also do the gentle stretching and exercises to help with pain.  You can take Tylenol as needed for pain.  Once you have completed the steroids, you can also take Ibuprofen as needed.   Drink plenty of fluids and rest.    Return or go to the Emergency Department if symptoms worsen or do not improve in the next few days.

## 2021-01-25 NOTE — ED Provider Notes (Signed)
[facility]    CSN: [id number] Arrival date & time: 01/25/21  1418      History   Chief Complaint Chief Complaint  Patient presents with   Abdominal Pain    HPI Cheryl Gilbert is a 45 y.o. female.   Patient here for evaluation of right lower back pain that radiates to right lower abdomen and down right leg for the past week.  Denies any dysuria, urgency, or frequency.  Denies any nausea, vomiting, diarrhea, or constipation.  Reports pain is worse with movement and improves with rest.  Denies any trauma, injury, or other precipitating event.  Denies any fevers, chest pain, shortness of breath, N/V/D, numbness, tingling, weakness, abdominal pain, or headaches.    The history is provided by the patient.  Abdominal Pain Associated symptoms: no constipation, no diarrhea, no nausea and no vomiting    Past Medical History:  Diagnosis Date   Anemia    Asthma    Eczema    Hypoglycemia associated with diabetes (HCC)    Hypothyroidism    "Not on meds since 1996"   Migraine headache     Patient Active Problem List   Diagnosis Date Noted   MDD (major depressive disorder), severe (HCC) 08/08/2018    Past Surgical History:  Procedure Laterality Date   TUBAL LIGATION      OB History     Gravida  7   Para  5   Term  3   Preterm  2   AB  2   Living  5      SAB  2   IAB  0   Ectopic  0   Multiple  0   Live Births  5            Home Medications    Prior to Admission medications   Medication Sig Start Date End Date Taking? Authorizing Provider  Ferrous Gluconate (IRON 27 PO) Take by mouth.   Yes [provider]  predniSONE (DELTASONE) 20 MG tablet Take 2 tablets (40 mg total) by mouth daily for 5 days. 01/25/21 01/30/21 Yes Ivette Loyal, NP  vitamin B-12 (CYANOCOBALAMIN) 100 MCG tablet Take 100 mcg by mouth daily.   Yes [provider]  famotidine (PEPCID) 20 MG tablet Take 1 tablet (20 mg total) by mouth daily.  For acid reflux 08/10/18   Armandina Stammer I, NP  nicotine (NICODERM CQ - DOSED IN MG/24 HR) 7 mg/24hr patch Place 1 patch (7 mg total) onto the skin daily. (May purchase from over the counter): For smoking cessation. 08/10/18   Armandina Stammer I, NP  potassium chloride SA (K-DUR,KLOR-CON) 20 MEQ tablet Take 1 tablet (20 mEq total) by mouth 2 (two) times daily. Potassium replacement 08/09/18   Armandina Stammer I, NP  sertraline (ZOLOFT) 25 MG tablet Take 1 tablet (25 mg total) by mouth daily. For depression 08/10/18   Armandina Stammer I, NP  traZODone (DESYREL) 50 MG tablet Take 1 tablet (50 mg total) by mouth at bedtime as needed for sleep. 08/09/18   Sanjuana Kava, NP    Family History Family History  Problem Relation Age of Onset   Cancer Mother    Mental illness Mother    Multiple sclerosis Father    Hypertension Maternal Grandmother    Diabetes Maternal Grandfather    Hypertension Maternal Grandfather     Social History Social History   Tobacco Use   Smoking status: Every Day    Packs/day: 0.25  Years: 6.00    Pack years: 1.50    Types: Cigarettes   Smokeless tobacco: Never  Vaping Use   Vaping Use: Never used  Substance Use Topics   Alcohol use: Yes    Comment: occasional alcohol use twice a year   Drug use: No     Allergies   Patient has no known allergies.   Review of Systems Review of Systems  Gastrointestinal:  Positive for abdominal pain. Negative for constipation, diarrhea, nausea and vomiting.  Musculoskeletal:  Positive for back pain.  All other systems reviewed and are negative.   Physical Exam Triage Vital Signs ED Triage Vitals  Enc Vitals Group     BP 01/25/21 1442 119/75     Pulse Rate 01/25/21 1442 79     Resp 01/25/21 1442 18     Temp 01/25/21 1442 99.3 F (37.4 C)     Temp Source 01/25/21 1442 Oral     SpO2 01/25/21 1442 100 %     Weight --      Height --      Head Circumference --      Peak Flow --      Pain Score 01/25/21 1440 6     Pain Loc --       Pain Edu? --      Excl. in GC? --    No data found.  Updated Vital Signs BP 119/75 (BP Location: Right Arm)   Pulse 79   Temp 99.3 F (37.4 C) (Oral)   Resp 18   LMP  (Within Months) Comment: 1 month  SpO2 100%   Visual Acuity Right Eye Distance:   Left Eye Distance:   Bilateral Distance:    Right Eye Near:   Left Eye Near:    Bilateral Near:     Physical Exam Vitals and nursing note reviewed.  Constitutional:      General: She is not in acute distress.    Appearance: Normal appearance. She is not ill-appearing, toxic-appearing or diaphoretic.  HENT:     Head: Normocephalic and atraumatic.  Eyes:     Conjunctiva/sclera: Conjunctivae normal.  Cardiovascular:     Rate and Rhythm: Normal rate.     Pulses: Normal pulses.  Pulmonary:     Effort: Pulmonary effort is normal.  Abdominal:     General: Abdomen is flat.     Palpations: Abdomen is soft.     Tenderness: There is no abdominal tenderness.  Musculoskeletal:     Cervical back: Normal range of motion.     Thoracic back: Normal. No tenderness or bony tenderness.     Lumbar back: Tenderness present. No bony tenderness. Positive right straight leg raise test. Negative left straight leg raise test.  Skin:    General: Skin is warm and dry.  Neurological:     General: No focal deficit present.     Mental Status: She is alert and oriented to person, place, and time.  Psychiatric:        Mood and Affect: Mood normal.     UC Treatments / Results  Labs (all labs ordered are listed, but only abnormal results are displayed) Labs Reviewed  POCT URINALYSIS DIPSTICK, ED / UC - Abnormal; Notable for the following components:      Result Value   Ketones, ur TRACE (*)    All other components within normal limits  POC URINE PREG, ED    EKG   Radiology No results found.  Procedures Procedures (including critical  care time)  Medications Ordered in UC Medications - No data to display  Initial Impression /  Assessment and Plan / UC Course  I have reviewed the triage vital signs and the nursing notes.  Pertinent labs & imaging results that were available during my care of the patient were reviewed by me and considered in my medical decision making (see chart for details).    Assessment negative for red flags or concerns.  Urinalysis positive for ketones but otherwise negative.  Likely sciatica.  Will treat with prednisone daily for the next 5 days.  Recommend gentle stretching and exercises.  Encourage fluids and rest.  May take Tylenol as needed for pain.  Follow-up with primary care as needed. Final Clinical Impressions(s) / UC Diagnoses   Final diagnoses:  Sciatica of right side     Discharge Instructions      Take the prednisone daily for the next 5 days.    You can also do the gentle stretching and exercises to help with pain.  You can take Tylenol as needed for pain.  Once you have completed the steroids, you can also take Ibuprofen as needed.   Drink plenty of fluids and rest.    Return or go to the Emergency Department if symptoms worsen or do not improve in the next few days.      ED Prescriptions     Medication Sig Dispense Auth. Provider   predniSONE (DELTASONE) 20 MG tablet Take 2 tablets (40 mg total) by mouth daily for 5 days. 10 tablet Ivette Loyal, NP      PDMP not reviewed this encounter.   Ivette Loyal, NP 01/25/21 [id number]

## 2021-12-20 IMAGING — CR DG ANKLE COMPLETE 3+V*R*
3 series · 3 of 3 positions shown · non-contrast
Comparison: None.

CLINICAL DATA: Ankle pain for 1 week

EXAM:
RIGHT ANKLE - COMPLETE 3+ VIEW

[ankle ap]
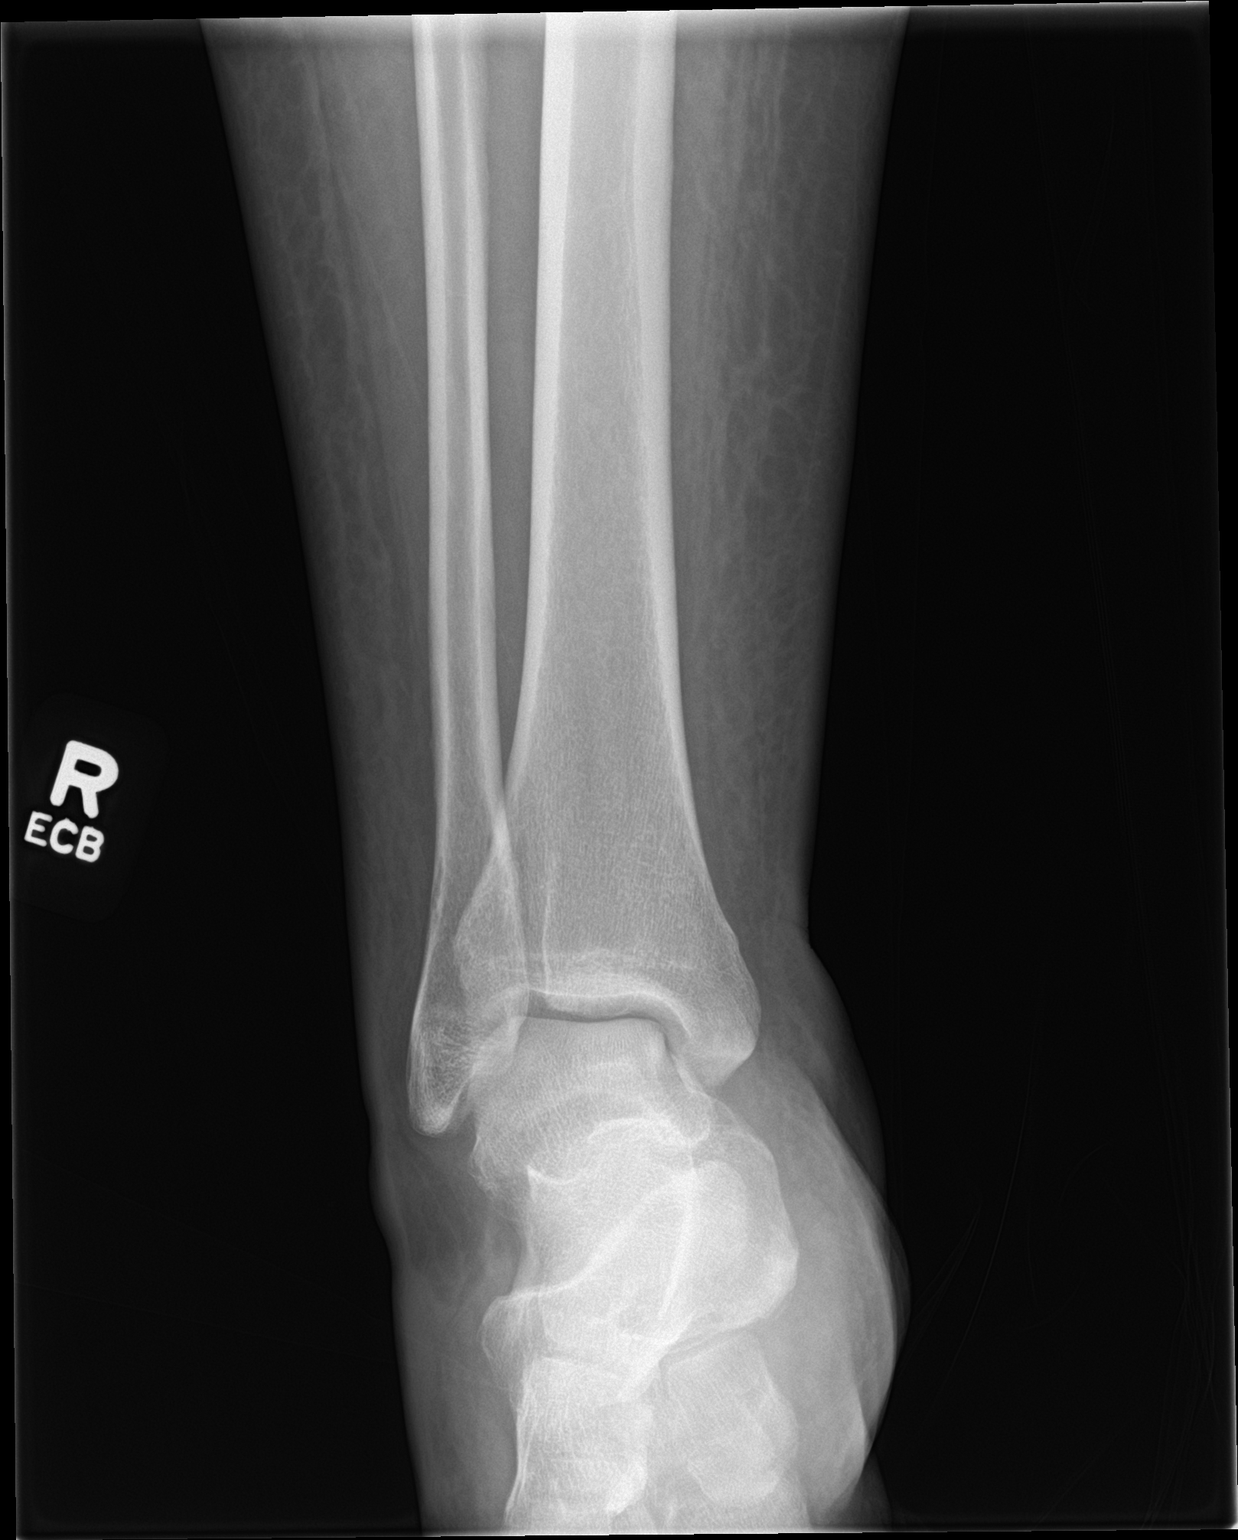

[ankle obl]
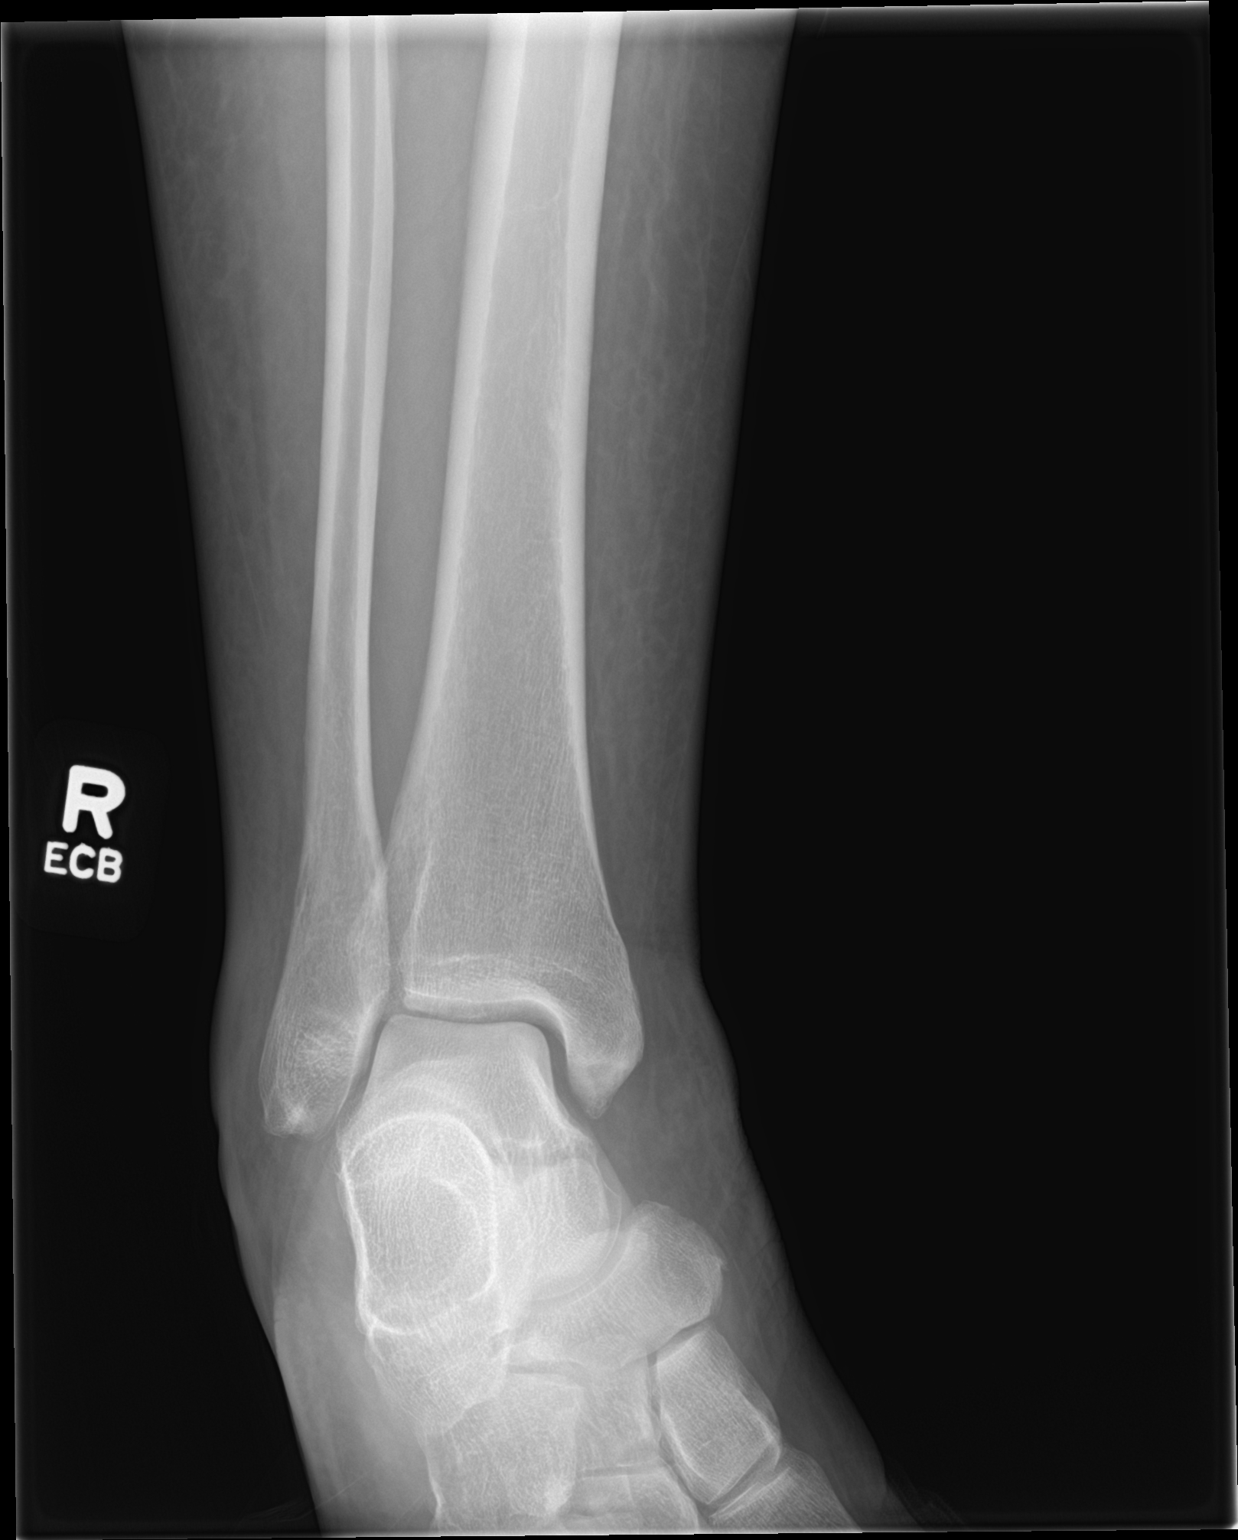

[ankle lat]
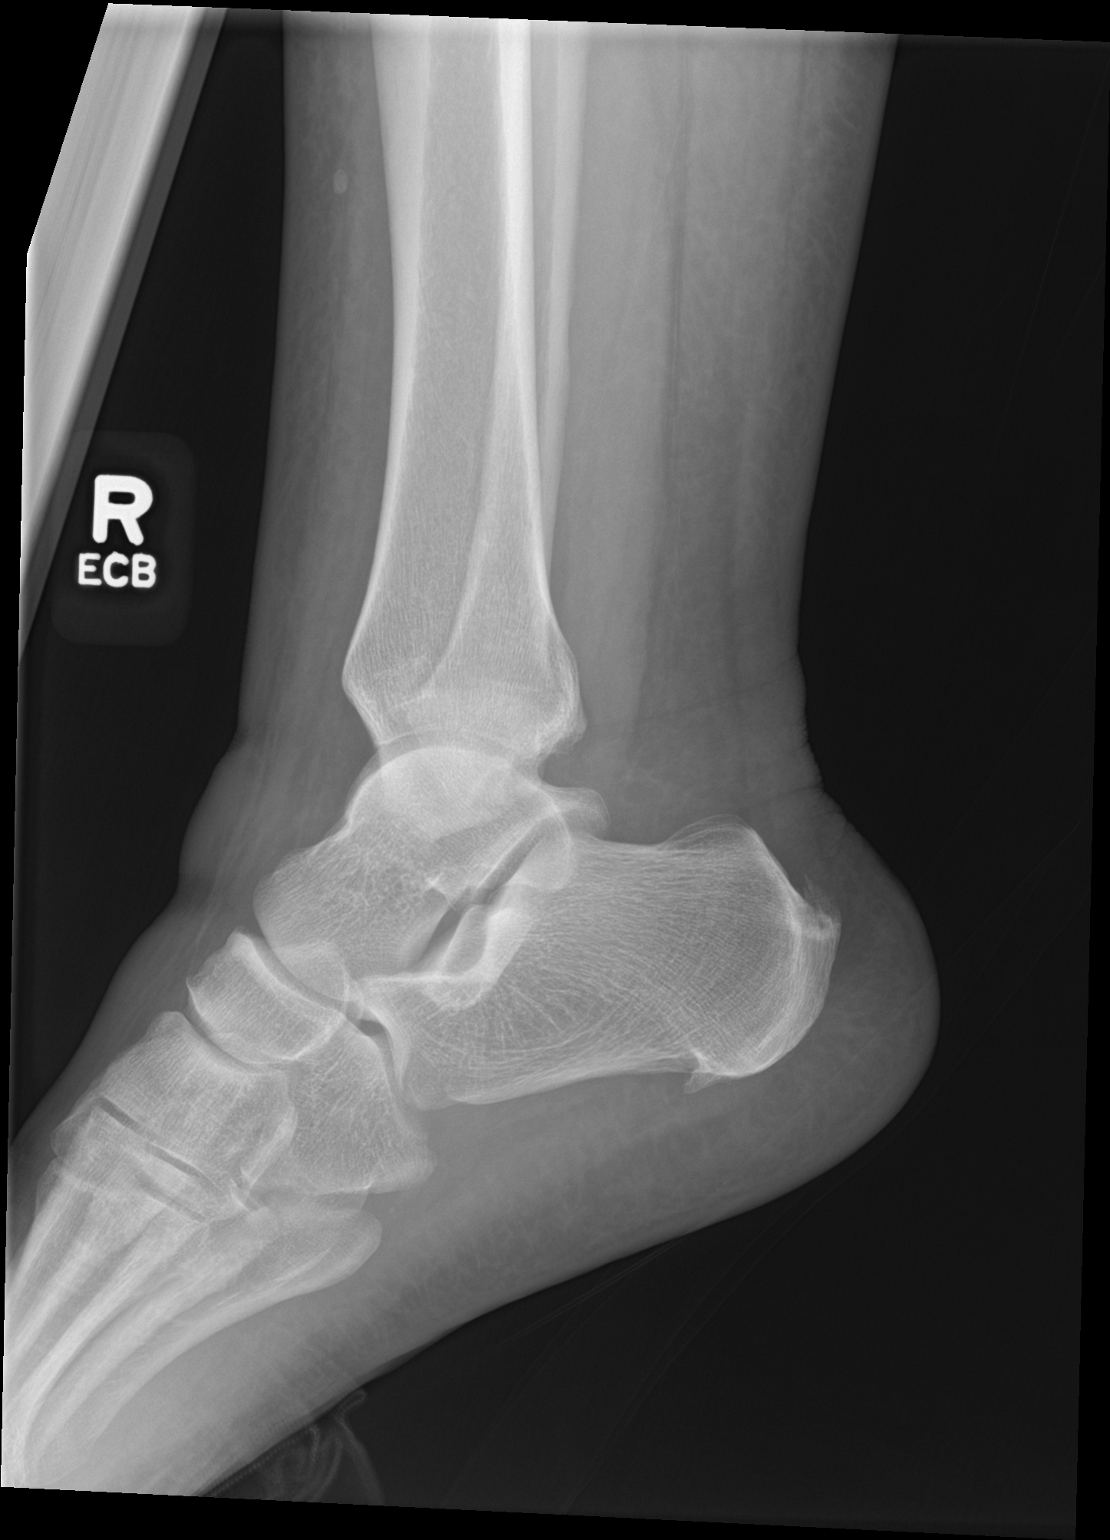

[3 of 3 positions shown; findings below may reference images not displayed]

FINDINGS: Subcutaneous edema. No fracture or malalignment. Small plantar
calcaneal spur.
IMPRESSION: Soft tissue edema.  No acute osseous abnormality

## 2022-03-22 ENCOUNTER — Emergency Department (HOSPITAL_COMMUNITY): Admission: EM | Admit: 2022-03-22 | Discharge: 2022-03-22 | Discharge status: Against Medical Advice | Payer: Self-pay

## 2022-03-22 NOTE — ED Notes (Signed)
X3 no answer for triage.

## 2022-03-22 NOTE — ED Notes (Signed)
X2 for triage room with no response

## 2022-04-11 ENCOUNTER — Ambulatory Visit (INDEPENDENT_AMBULATORY_CARE_PROVIDER_SITE_OTHER): Payer: BC Managed Care – PPO | Admitting: General Practice

## 2022-04-11 DIAGNOSIS — N912 Amenorrhea, unspecified: Secondary | ICD-10-CM

## 2022-04-11 DIAGNOSIS — Z3202 Encounter for pregnancy test, result negative: Secondary | ICD-10-CM

## 2022-04-11 DIAGNOSIS — Z3200 Encounter for pregnancy test, result unknown: Secondary | ICD-10-CM

## 2022-04-11 LAB — POCT URINE PREGNANCY: Preg Test, Ur: NEGATIVE

## 2022-04-11 NOTE — Progress Notes (Signed)
Ms. Swamy presents today for UPT.  LMP: 12-13-21    OBJECTIVE: Appears well, in no apparent distress.  OB History     Gravida  7   Para  5   Term  3   Preterm  2   AB  2   Living  5      SAB  2   IAB  0   Ectopic  0   Multiple  0   Live Births  5          Home UPT Result: Positive in July  In-Office UPT result: Negative Beta HCG Quant ordered.  I have reviewed the patient's medical, obstetrical, social, and family histories, and medications.   ASSESSMENT: Negative UPT in office. Beta HCG Quant ordered.   PLAN UPT negative today. Pt advised to schedule AEX and discuss signs and symptoms of menopause with provider.

## 2022-04-12 LAB — BETA HCG QUANT (REF LAB): hCG Quant: <1 m[IU]/mL

## 2022-04-19 ENCOUNTER — Ambulatory Visit: Payer: BC Managed Care – PPO | Admitting: Obstetrics

## 2022-04-27 ENCOUNTER — Other Ambulatory Visit: Payer: Self-pay

## 2022-04-27 ENCOUNTER — Inpatient Hospital Stay (HOSPITAL_COMMUNITY)
Admission: AD | Admit: 2022-04-27 | Discharge: 2022-04-27 | Disposition: A | Discharge status: Home / Self Care | Payer: BC Managed Care – PPO | Attending: Obstetrics and Gynecology | Admitting: Obstetrics and Gynecology

## 2022-04-27 DIAGNOSIS — Z3202 Encounter for pregnancy test, result negative: Secondary | ICD-10-CM | POA: Insufficient documentation

## 2022-04-27 DIAGNOSIS — N939 Abnormal uterine and vaginal bleeding, unspecified: Secondary | ICD-10-CM | POA: Insufficient documentation

## 2022-04-27 LAB — POCT PREGNANCY, URINE: Preg Test, Ur: NEGATIVE

## 2022-04-27 NOTE — MAU Provider Note (Signed)
[name] is a 46 y.o. [phone] female who presents to MAU today with complaint of VB and concern for pregnancy. Reports positive pregnancy test in July.    ROS: +VB  O BP (!) 139/96 (BP Location: Right Arm)   Pulse 70   Temp 98.1 F (36.7 C) (Oral)   Resp 18   Ht 5\' 5"  (1.651 m)   Wt 108.5 kg   LMP 01/21/2022   SpO2 100%   BMI 39.80 kg/m  Physical Exam Vitals and nursing note reviewed.  Constitutional:      General: She is not in acute distress.    Appearance: Normal appearance.  HENT:     Head: Normocephalic and atraumatic.  Cardiovascular:     Rate and Rhythm: Normal rate.  Pulmonary:     Effort: Pulmonary effort is normal. No respiratory distress.  Musculoskeletal:     Cervical back: Normal range of motion.  Neurological:     General: No focal deficit present.     Mental Status: She is alert and oriented to person, place, and time.  Psychiatric:        Mood and Affect: Mood normal.        Behavior: Behavior normal.    Results for orders placed or performed during the hospital encounter of 04/27/22 (from the past 24 hour(s))  Pregnancy, urine POC     Status: None   Collection Time: 04/27/22  5:39 PM  Result Value Ref Range   Preg Test, Ur NEGATIVE NEGATIVE   MDM: Chart reviewed: neg UPT and qhcg 2 weeks ago. Hx of BTL. No signs of pregnancy today. Pt states she has an appt at [facility] scheduled this month because she feels something is wrong, hasn't had a period in 4 months. Discussed other options for evaluation such as ED or UC. Pt plans to call [facility] tomorrow for sooner appt. Stable for discharge home.   A 1. Negative pregnancy test   2. Vaginal bleeding    P Discharge from MAU in stable condition Warning signs for worsening condition that would warrant emergency follow-up discussed Patient may return to MAU as needed for pregnancy related complaints  Allergies as of 04/27/2022   No Known Allergies      Medication List     TAKE  these medications    famotidine 20 MG tablet Commonly known as: PEPCID Take 1 tablet (20 mg total) by mouth daily. For acid reflux   IRON 27 PO Take by mouth.   nicotine 7 mg/24hr patch Commonly known as: NICODERM CQ - dosed in mg/24 hr Place 1 patch (7 mg total) onto the skin daily. (May purchase from over the counter): For smoking cessation.   potassium chloride SA 20 MEQ tablet Commonly known as: KLOR-CON M Take 1 tablet (20 mEq total) by mouth 2 (two) times daily. Potassium replacement   sertraline 25 MG tablet Commonly known as: ZOLOFT Take 1 tablet (25 mg total) by mouth daily. For depression   traZODone 50 MG tablet Commonly known as: DESYREL Take 1 tablet (50 mg total) by mouth at bedtime as needed for sleep.   vitamin B-12 100 MCG tablet Commonly known as: CYANOCOBALAMIN Take 100 mcg by mouth daily.        Julianne Handler, CNM 04/27/2022 6:46 PM

## 2022-04-27 NOTE — MAU Note (Addendum)
Ardella Hardrick-Chambers is a 46 y.o. at Unknown here in MAU reporting: she's thinks she's having a miscarriage.  Reports having large amount of V, saturated through 2 pads in 30 minutes.  Reports +HPT in July 2023. LMP: 01/21/2022 Onset of complaint: today Pain score: 8 Vitals:   04/27/22 1733  BP: (!) 139/96  Pulse: 70  Resp: 18  Temp: 98.1 F (36.7 C)  SpO2: 100%     FHT:N/A Lab orders placed from triage:   UPT

## 2022-05-16 ENCOUNTER — Ambulatory Visit: Payer: BC Managed Care – PPO | Admitting: Obstetrics
